# Patient Record
Sex: Female | Born: 1950 | Race: White | Hispanic: No | Marital: Married | Smoking: Never smoker
Health system: Western US, Academic
[De-identification: ages and names within clinical notes are randomized; demographics above are authoritative.]

## PROBLEM LIST (undated history)

## (undated) DIAGNOSIS — M65331 Trigger finger, right middle finger: Secondary | ICD-10-CM

## (undated) DIAGNOSIS — H6981 Other specified disorders of Eustachian tube, right ear: Secondary | ICD-10-CM

## (undated) DIAGNOSIS — K580 Irritable bowel syndrome with diarrhea: Secondary | ICD-10-CM

## (undated) DIAGNOSIS — E785 Hyperlipidemia, unspecified: Secondary | ICD-10-CM

## (undated) DIAGNOSIS — M159 Polyosteoarthritis, unspecified: Secondary | ICD-10-CM

## (undated) DIAGNOSIS — M170 Bilateral primary osteoarthritis of knee: Secondary | ICD-10-CM

## (undated) DIAGNOSIS — K5730 Diverticulosis of large intestine without perforation or abscess without bleeding: Secondary | ICD-10-CM

## (undated) DIAGNOSIS — J301 Allergic rhinitis due to pollen: Secondary | ICD-10-CM

## (undated) HISTORY — DX: Bilateral primary osteoarthritis of knee: M17.0

## (undated) HISTORY — DX: Irritable bowel syndrome with diarrhea: K58.0

## (undated) HISTORY — DX: Trigger finger, right middle finger: M65.331

## (undated) HISTORY — DX: Diverticulosis of large intestine without perforation or abscess without bleeding: K57.30

## (undated) HISTORY — DX: Hyperlipidemia, unspecified: E78.5

## (undated) HISTORY — DX: Polyosteoarthritis, unspecified: M15.9

## (undated) HISTORY — DX: Allergic rhinitis due to pollen: J30.1

## (undated) HISTORY — DX: Other specified disorders of eustachian tube, right ear: H69.81

---

## 2012-05-03 HISTORY — PX: COLONOSCOPY: GILAB00002

## 2013-06-27 ENCOUNTER — Other Ambulatory Visit
Admission: RE | Admit: 2013-06-27 | Discharge: 2013-06-27 | Disposition: A | Discharge status: Home / Self Care | Payer: BC Managed Care – PPO | Source: Ambulatory Visit | Attending: Gynecology | Admitting: Gynecology

## 2013-06-27 DIAGNOSIS — Z01419 Encounter for gynecological examination (general) (routine) without abnormal findings: Principal | ICD-10-CM | POA: Insufficient documentation

## 2013-07-03 LAB — CYTOLOGY GYN

## 2013-07-08 LAB — HPV DNA, HIGH RISK
HPV HIGH RISK GROUP: NEGATIVE
HPV TYPE 16: NEGATIVE
HPV TYPE 18: NEGATIVE

## 2016-03-29 ENCOUNTER — Encounter (HOSPITAL_BASED_OUTPATIENT_CLINIC_OR_DEPARTMENT_OTHER): Payer: Self-pay | Admitting: Family Medicine

## 2016-03-29 ENCOUNTER — Ambulatory Visit (HOSPITAL_BASED_OUTPATIENT_CLINIC_OR_DEPARTMENT_OTHER): Admitting: Family Medicine

## 2016-03-29 VITALS — BP 130/80 | HR 74 | Temp 97.7°F | Resp 12 | Ht 62.0 in | Wt 148.0 lb

## 2016-03-29 DIAGNOSIS — R0789 Other chest pain: Principal | ICD-10-CM

## 2016-03-29 DIAGNOSIS — J301 Allergic rhinitis due to pollen: Secondary | ICD-10-CM | POA: Insufficient documentation

## 2016-03-29 DIAGNOSIS — K2100 Gastro-esophageal reflux disease with esophagitis, without bleeding: Secondary | ICD-10-CM

## 2016-03-29 DIAGNOSIS — E785 Hyperlipidemia, unspecified: Secondary | ICD-10-CM | POA: Insufficient documentation

## 2016-03-29 DIAGNOSIS — H6981 Other specified disorders of Eustachian tube, right ear: Secondary | ICD-10-CM | POA: Insufficient documentation

## 2016-03-29 DIAGNOSIS — K580 Irritable bowel syndrome with diarrhea: Secondary | ICD-10-CM | POA: Insufficient documentation

## 2016-03-29 DIAGNOSIS — M65331 Trigger finger, right middle finger: Secondary | ICD-10-CM | POA: Insufficient documentation

## 2016-03-29 DIAGNOSIS — R0602 Shortness of breath: Secondary | ICD-10-CM

## 2016-03-29 DIAGNOSIS — K5730 Diverticulosis of large intestine without perforation or abscess without bleeding: Secondary | ICD-10-CM | POA: Insufficient documentation

## 2016-03-29 DIAGNOSIS — M159 Polyosteoarthritis, unspecified: Secondary | ICD-10-CM | POA: Insufficient documentation

## 2016-03-29 MED ORDER — OMEPRAZOLE 40 MG CAPSULE,DELAYED RELEASE
40.0000 mg | DELAYED_RELEASE_CAPSULE | Freq: Every day | ORAL | 0 refills | Status: AC
Start: 2016-03-29 — End: 2016-04-28

## 2016-03-29 NOTE — Progress Notes (Addendum)
HPI:  Rachel [name] is a [age]yr-old female who is here today for concerns of chest tightness, sore throat.    The sensation of soreness with a lump in her throat has been occurring mildly over the past month.  It has slowly increase over time.  She denies abdominal pain, N/V/D, constipation, BRBPR, hematochezia or melena.     Over this past week, her sensations have started to include chest pressure with shortness of breath.  However, she denies other cardiac-related symptoms - no headaches, lightheadedness, diaphoresis, peripheral edema or palpitations.   These episodes are not related to physical exertion; nausea/vomiting; sweating; pain radiation into the jaw or shoulder; extreme and sudden weakness or anxiety.    An ECG was ordered, which reveal NSR, normal wave morphology, intervals and axis.  There were no cardiac concerns identified.    We discussed possible etiologies, including cardiac concerns and possible GERD (esophageal spasm, esophageal stricture, hiatal hernia) as well as further treatment and workup.  She verbalized her understanding and willingness to move forward with the plan of care.    Patient is otherwise doing well.  Denies any concerns over other chronic health conditions.  Denies any recent acute illnesses.    30 minutes was spent with this patient. Over 20 minutes was spent in coordination and counseling.     ROS:  10-point ROS performed. Pertinent details discussed in HPI. Otherwise, denies any issues or other physical concerns    Physical Exam   Constitutional: She is oriented to person, place, and time. She appears well-developed.   HENT:   Head: Normocephalic and atraumatic.   Eyes: Conjunctivae and EOM are normal. Pupils are equal, round, and reactive to light.   Neck: Normal range of motion.   Cardiovascular: Normal rate, regular rhythm and normal heart sounds.  Exam reveals no gallop and no friction rub.    No murmur heard.  Abdominal: Soft. Bowel sounds are normal. She exhibits no  distension and no mass. There is no tenderness.   Musculoskeletal: Normal range of motion.   Neurological: She is alert and oriented to person, place, and time.   Skin: Skin is warm and dry.   Psychiatric: She has a normal mood and affect. Her behavior is normal. Judgment and thought content normal.   Vitals reviewed.

## 2016-04-11 LAB — POC ELECTROCARDIOGRAM WITH RHYTHM STRIP: QTC: 414 ms

## 2016-04-12 ENCOUNTER — Encounter (HOSPITAL_BASED_OUTPATIENT_CLINIC_OR_DEPARTMENT_OTHER): Payer: Self-pay | Admitting: Family Medicine

## 2016-04-12 ENCOUNTER — Ambulatory Visit (HOSPITAL_BASED_OUTPATIENT_CLINIC_OR_DEPARTMENT_OTHER): Admitting: Family Medicine

## 2016-04-12 VITALS — BP 142/78 | HR 66 | Temp 96.4°F | Resp 14 | Ht 64.0 in | Wt 146.8 lb

## 2016-04-12 DIAGNOSIS — R0989 Other specified symptoms and signs involving the circulatory and respiratory systems: Principal | ICD-10-CM

## 2016-04-12 DIAGNOSIS — R0602 Shortness of breath: Secondary | ICD-10-CM

## 2016-04-12 DIAGNOSIS — K2100 Gastro-esophageal reflux disease with esophagitis, without bleeding: Secondary | ICD-10-CM

## 2016-04-12 DIAGNOSIS — I361 Nonrheumatic tricuspid (valve) insufficiency: Secondary | ICD-10-CM

## 2016-04-12 DIAGNOSIS — R1310 Dysphagia, unspecified: Secondary | ICD-10-CM

## 2016-04-12 DIAGNOSIS — R0789 Other chest pain: Secondary | ICD-10-CM

## 2016-04-12 NOTE — Progress Notes (Signed)
HPI:  Rachel [name] is a [age]yr-old female who is here today for concerns of chest tightness, sore throat.    Today she is able to better clarify the sensation in her throat.  It feels like an air bubble or pocket is trapped below her throat [she indicates an area just above the sternal notch].  She feels as though she has to burp to release it.  Doing so can improve the sensation, but not always.  At times, it is better.  However, it seems to be there constantly.    She denies abdominal pain, N/V/D, constipation, BRBPR, hematochezia or melena.  She also denies true dysphagia.  Omeprazole did not really help with her symptoms.    In the interim time, chest pressure persists, but is better.  Her feelings of being short of breath has resolved.      She continues to deny other cardiac-related symptoms - no headaches, lightheadedness, diaphoresis, peripheral edema or palpitations.   As noted previously, these episodes are not related to physical exertion; nausea/vomiting; sweating; pain radiation into the jaw or shoulder; extreme and sudden weakness or anxiety.    We again discussed possible etiologies, including cardiac concerns, as well as further treatment and workup.  She verbalized her understanding and willingness to move forward with the plan of care.    Patient is otherwise doing well.  Denies any concerns over other chronic health conditions.  Denies any recent acute illnesses.    30 minutes was spent with this patient. Over 20 minutes was spent in coordination and counseling.     ROS:  10-point ROS performed. Pertinent details discussed in HPI. Otherwise, denies any issues or other physical concerns    Vital Signs:  BP 142/78  Pulse 66  Temp (!) 35.8 C (96.4 F) (Temporal)  Resp 14  Ht 1.626 m (5\' 4" )  Wt 66.6 kg (146 lb 12.8 oz)  SpO2 97%  BMI 25.2 kg/m2      Physical Exam   Constitutional: She is oriented to person, place, and time. She appears well-developed.   HENT:   Head: Normocephalic and  atraumatic.   Eyes: Conjunctivae and EOM are normal. Pupils are equal, round, and reactive to light.   Neck: Normal range of motion.   Cardiovascular: Normal rate, regular rhythm and normal heart sounds.  Exam reveals no gallop and no friction rub.    No murmur heard.  Abdominal: Soft. Bowel sounds are normal. She exhibits no distension and no mass. There is no tenderness.   Musculoskeletal: Normal range of motion.   Neurological: She is alert and oriented to person, place, and time.   Skin: Skin is warm and dry.   Psychiatric: She has a normal mood and affect. Her behavior is normal. Judgment and thought content normal.   Vitals reviewed.

## 2016-04-27 ENCOUNTER — Other Ambulatory Visit: Payer: Self-pay | Admitting: Gastroenterology

## 2016-04-27 DIAGNOSIS — R1314 Dysphagia, pharyngoesophageal phase: Secondary | ICD-10-CM

## 2016-05-01 ENCOUNTER — Ambulatory Visit
Admission: RE | Admit: 2016-05-01 | Discharge: 2016-05-01 | Disposition: A | Discharge status: Home / Self Care | Source: Ambulatory Visit | Attending: Gastroenterology | Admitting: Gastroenterology

## 2016-05-01 ENCOUNTER — Ambulatory Visit

## 2016-05-01 DIAGNOSIS — R072 Precordial pain: Principal | ICD-10-CM

## 2016-05-01 DIAGNOSIS — R1314 Dysphagia, pharyngoesophageal phase: Secondary | ICD-10-CM

## 2016-05-01 LAB — CBC WITH DIFFERENTIAL
BASOPHILS % AUTO: 0.5 % (ref 0.0–1.0)
BASOPHILS ABS AUTO: 0 10*3/uL (ref 0.0–0.1)
EOSINOPHIL % AUTO: 0.9 % (ref 0.0–4.0)
EOSINOPHIL ABS AUTO: 0.1 10*3/uL (ref 0.0–0.2)
HEMATOCRIT: 38.1 % (ref 36.0–48.0)
HEMOGLOBIN: 13.1 g/dL (ref 12.0–16.0)
IMMATURE GRANULOCYTES % AUTO: 0.2 % (ref 0.00–0.50)
IMMATURE GRANULOCYTES ABS AUTO: 0 10*3/uL (ref 0.0–0.0)
LYMPHOCYTE ABS AUTO: 2.1 10*3/uL (ref 1.3–2.9)
LYMPHOCYTES % AUTO: 32 % (ref 5.0–41.0)
MCH: 30.2 pg (ref 27.0–34.0)
MCHC g/dL: 34.4 g/dL (ref 33.0–37.0)
MCV: 87.8 fL (ref 82.0–97.0)
MONOCYTES % AUTO: 7.2 % (ref 0.0–10.0)
MONOCYTES ABS AUTO: 0.5 10*3/uL (ref 0.3–0.8)
MPV: 9.1 fL — AB (ref 9.4–12.4)
NEUTROPHIL ABS AUTO: 3.79 10*3/uL (ref 2.20–4.80)
NEUTROPHILS % AUTO: 59.2 % (ref 45.0–75.0)
NUCLEATED CELL COUNT: 0 10*3/uL (ref 0.0–0.1)
NUCLEATED RBC/100 WBC: 0 %{WBCs} (ref <=0.0)
Platelet Count: 227 10*3/uL (ref 151–365)
RDW: 11.9 % (ref 11.5–14.5)
RED CELL COUNT: 4.34 10*6/uL (ref 3.80–5.10)
WHITE BLOOD CELL COUNT: 6.4 10*3/uL (ref 4.2–10.8)

## 2016-05-01 LAB — COMPREHENSIVE METABOLIC PANEL
ALANINE TRANSFERASE (ALT): 26 U/L (ref 4–56)
ALB/GLOB RATIO_MMC: 1.6 (ref 1.0–1.6)
ALBUMIN: 4.2 g/dL (ref 3.2–4.7)
ALKALINE PHOSPHATASE (ALP): 57 U/L (ref 38–126)
ASPARTATE TRANSAMINASE (AST): 23 U/L (ref 9–44)
BILIRUBIN TOTAL: 0.6 mg/dL (ref 0.1–2.2)
BUN/CREATININE_MMC: 29.1 — AB (ref 7.3–21.7)
CARBON DIOXIDE TOTAL: 28 mmol/L (ref 22–32)
CHLORIDE: 105 mmol/L (ref 99–109)
CREATININE BLOOD: 0.55 mg/dL (ref 0.50–1.30)
Calcium: 9.7 mg/dL (ref 8.7–10.2)
E-GFR, NON-AFRICAN AMERICAN: 111 mL/min/{1.73_m2}
E-GFR_MMC: 111 mL/min/{1.73_m2}
GLOBULIN BLOOD_MMC: 2.7 g/dL (ref 2.2–4.2)
GLUCOSE: 97 mg/dL (ref 70–100)
POTASSIUM: 3.9 mmol/L (ref 3.5–5.2)
Protein: 6.9 g/dL (ref 5.9–8.2)
Sodium: 140 mmol/L (ref 134–143)
UREA NITROGEN, BLOOD (BUN): 16 mg/dL (ref 6–21)

## 2016-05-01 MED ORDER — BARIUM SULFATE 98 % ORAL POWDER FOR SUSPENSION
135.0000 mL | INHALATION_SUSPENSION | ORAL | Status: AC
Start: 2016-05-01 — End: 2016-05-01
  Administered 2016-05-01: 135 mL via ORAL

## 2016-05-06 ENCOUNTER — Ambulatory Visit
Admission: RE | Admit: 2016-05-06 | Discharge: 2016-05-06 | Disposition: A | Discharge status: Home / Self Care | Source: Ambulatory Visit | Attending: Family Medicine | Admitting: Family Medicine

## 2016-05-06 DIAGNOSIS — R0789 Other chest pain: Principal | ICD-10-CM

## 2016-05-06 DIAGNOSIS — R0602 Shortness of breath: Secondary | ICD-10-CM

## 2016-05-06 DIAGNOSIS — I361 Nonrheumatic tricuspid (valve) insufficiency: Secondary | ICD-10-CM

## 2016-05-06 DIAGNOSIS — I081 Rheumatic disorders of both mitral and tricuspid valves: Secondary | ICD-10-CM

## 2016-05-06 DIAGNOSIS — I5030 Unspecified diastolic (congestive) heart failure: Secondary | ICD-10-CM

## 2016-05-06 NOTE — Allied Health Procedure (Signed)
ECHOCARDIOGRAM PRELIMINARY REPORT      PATIENT: Rachel BlazerBarbara [name] Stock MRN: [id number]   DOB: [birth year] AGE: [age]yr         M-Mode        RVID (2.1-3.2):  2.9 ASC AO:  2.8 IVS (0.7-1.2): 1.0   LVIDd (4.0-5.6):  3.7   PW (0.7-12): 0.9   LVIDs (2.0-3.8):  2.4   %FS (25-50%): 35     LA:  4.8 major   4.1 minor LVOT Diameter: 2.0   ACS (1.6-2.6):  1.9 RA:  4.5 major   3.1 minor     AORoot (2.2-4.0):  2.9 L.A.V.I.: 29     LA (2.5-4.1):  3.3 IVC: 16   Collapse: yes PERICARDIAL  EFFUSION NO       MITRAL   E/A RATIO:   1.38     E/E:   8   GRADIENT MAX   NA Mean   NA mmHg   MV DECEL TIME   169   REGURG. TRACE    AORTIC   MAX   9     Mean   5 mmHg   PEAK VEL   1.54     V1 VTI   23     V2 VTI   33   V1/V2 Ratio   0.7   AVA   2.3 cm2 (Continuity Equation)   REGURG. NONE   IVRT   121          TRICUSPID   TR GRADIENT MAX   40 mmHg   REGURG. TRACE       PULMONIC   P.A.T.   138 msec.   REGURG. NONE           Tech Signature: Randa SpikeCecilia L Koda Defrank  Date: 05/06/2016  Time: 9:53am     Additional Details:    NL LV SIZE AND FUNCTION  LT ARM BP+ 134/73

## 2016-05-07 NOTE — Procedures (Signed)
ECHOCARDIOGRAM  PATIENT NAME:[name], Rachel ADMIT DATE: 05/06/2016   [name]    DOB:23-Feb-1951               DISCHARGE DATE:   AGE:66                       DATE OF VISIT:05/06/2016      INDICATION:    Followup valvular heart disease.     This is a complete transthoracic echocardiogram performed with 2D, M-mode, spectral Doppler, and color flow Doppler to evaluate ventricular and valvular structure and function.     The image quality of the study is good.    FINDINGS:    1. Left ventricle: Left ventricle has a small size with normal wall thickness. LVId 3.7 cm. Interventricular septum and posterior wall both 0.9 to 1.0 cm. Left ventricle has normal systolic function with ejection fraction of about 55% to 60%. Grade 2/4   diastolic dysfunction.   2. Aortic valve: A trileaflet structure. No aortic stenosis. No aortic regurgitation.   3. Mitral valve: Trace mitral regurgitation.   4. Left atrium: Mildly dilated.   5. Right ventricle: Normal size and function.   6. Pulmonic valve: No significant pulmonic regurgitation.   7. Tricuspid valve: Trace tricuspid regurgitation. No good TR jet to evaluate pulmonary artery systolic pressure.   8. Right atrium: Mildly dilated.   9. The interatrial septum is intact based on limited evaluation.   10. The inferior vena cava measures 1.6 cm and has normal respiratory variation.   11. No pericardial effusion.   12. No pleural effusion.   13. Aortic root is normal in size, 2.8 cm.    CONCLUSIONS:    1. Small left ventricle with normal wall thickness.   2. Normal left ventricular systolic function with ejection fraction of 60% to 65%.   3. Grade 2/4 diastolic dysfunction.   4. Trace mitral regurgitation/trace tricuspid regurgitation.   5. Mild biatrial enlargement.   6. Normal right ventricular size and function.   7. No pericardial effusion.        Electronically Signed by Alona Beneaming Kasy Iannacone, MD on 05/22/2016 13:07:46  Alona Beneaming Zein Helbing, MD    Carmon GinsbergSamuel J. Ceridon, MD  DI: DZ  DD: 05/06/2016 15:12:44            JOB: [id number]  DT: 05/06/2016 20:14:38  DICT ID [id number]   MT: TM      Alona Beneaming Vaidehi Braddy, MD             PATIENT NAMMarland Kitchen: [name], Rachel                              [name]                               [id number]                             CSN-VISIT ID: [id number]                             DATE: 05/06/2016                             PATIENT ROOM #:  ECHOCARDIOGRAM   Page  2 of 2

## 2016-05-12 ENCOUNTER — Ambulatory Visit (HOSPITAL_BASED_OUTPATIENT_CLINIC_OR_DEPARTMENT_OTHER): Payer: Self-pay | Admitting: Family Medicine

## 2016-05-24 ENCOUNTER — Telehealth (HOSPITAL_BASED_OUTPATIENT_CLINIC_OR_DEPARTMENT_OTHER): Payer: Self-pay | Admitting: Family Medicine

## 2016-05-24 NOTE — Telephone Encounter (Signed)
Stated that she is in [place] visiting her sister and her dog bit her lip, broke skin; she's ok, but wants to know if she's up to date on her tetanus vaccine.  I checked her chart in [facility]; she had a tdap 08/11/15

## 2016-05-24 NOTE — Telephone Encounter (Signed)
There was an order for her to get Tdap, but didn't get it. No record of last tetanus. We were out of the TD for quite sometime.

## 2016-05-24 NOTE — Telephone Encounter (Signed)
Without actual verification of Td or TdaP, she should be considered not up-to-date.  I recommended that she present to an urgent care clinic to obtain a Td if not the TdaP (recommend getting the TdaP).

## 2016-05-25 NOTE — Telephone Encounter (Signed)
Spoke with patient, she will get a td at a urgent care facility in [place]

## 2016-05-30 ENCOUNTER — Encounter (HOSPITAL_BASED_OUTPATIENT_CLINIC_OR_DEPARTMENT_OTHER): Payer: Self-pay | Admitting: Family Medicine

## 2016-06-05 ENCOUNTER — Encounter (HOSPITAL_BASED_OUTPATIENT_CLINIC_OR_DEPARTMENT_OTHER): Payer: Self-pay | Admitting: Family Medicine

## 2016-06-05 DIAGNOSIS — M170 Bilateral primary osteoarthritis of knee: Secondary | ICD-10-CM | POA: Insufficient documentation

## 2016-06-06 ENCOUNTER — Encounter (HOSPITAL_BASED_OUTPATIENT_CLINIC_OR_DEPARTMENT_OTHER): Payer: Self-pay | Admitting: Family Medicine

## 2016-06-06 ENCOUNTER — Ambulatory Visit (HOSPITAL_BASED_OUTPATIENT_CLINIC_OR_DEPARTMENT_OTHER): Admitting: Family Medicine

## 2016-06-06 VITALS — BP 142/76 | HR 74 | Temp 97.7°F | Resp 12 | Wt 148.0 lb

## 2016-06-06 DIAGNOSIS — K5730 Diverticulosis of large intestine without perforation or abscess without bleeding: Secondary | ICD-10-CM

## 2016-06-06 DIAGNOSIS — M170 Bilateral primary osteoarthritis of knee: Secondary | ICD-10-CM

## 2016-06-06 DIAGNOSIS — K2100 Gastro-esophageal reflux disease with esophagitis, without bleeding: Secondary | ICD-10-CM

## 2016-06-06 DIAGNOSIS — R0989 Other specified symptoms and signs involving the circulatory and respiratory systems: Secondary | ICD-10-CM

## 2016-06-06 DIAGNOSIS — Z1231 Encounter for screening mammogram for malignant neoplasm of breast: Secondary | ICD-10-CM

## 2016-06-06 DIAGNOSIS — J301 Allergic rhinitis due to pollen: Secondary | ICD-10-CM

## 2016-06-06 DIAGNOSIS — K580 Irritable bowel syndrome with diarrhea: Secondary | ICD-10-CM

## 2016-06-06 DIAGNOSIS — M159 Polyosteoarthritis, unspecified: Secondary | ICD-10-CM

## 2016-06-06 DIAGNOSIS — Z1239 Encounter for other screening for malignant neoplasm of breast: Secondary | ICD-10-CM

## 2016-06-06 DIAGNOSIS — R1314 Dysphagia, pharyngoesophageal phase: Secondary | ICD-10-CM

## 2016-06-06 DIAGNOSIS — E785 Hyperlipidemia, unspecified: Secondary | ICD-10-CM

## 2016-06-06 DIAGNOSIS — R0789 Other chest pain: Principal | ICD-10-CM

## 2016-06-06 DIAGNOSIS — W540XXS Bitten by dog, sequela: Secondary | ICD-10-CM

## 2016-06-06 DIAGNOSIS — H6981 Other specified disorders of Eustachian tube, right ear: Secondary | ICD-10-CM

## 2016-06-06 NOTE — Patient Instructions (Signed)
DASH Diet: Care Instructions  Your Care Instructions  The DASH diet is an eating plan that can help lower your blood pressure. DASH stands for Dietary Approaches to Stop Hypertension. Hypertension is high blood pressure.  The DASH diet focuses on eating foods that are high in calcium, potassium, and magnesium. These nutrients can lower blood pressure. The foods that are highest in these nutrients are fruits, vegetables, low-fat dairy products, nuts, seeds, and legumes. But taking calcium, potassium, and magnesium supplements instead of eating foods that are high in those nutrients does not have the same effect. The DASH diet also includes whole grains, fish, and poultry.  The DASH diet is one of several lifestyle changes your doctor may recommend to lower your high blood pressure. Your doctor may also want you to decrease the amount of sodium in your diet. Lowering sodium while following the DASH diet can lower blood pressure even further than just the DASH diet alone.  Follow-up care is a key part of your treatment and safety. Be sure to make and go to all appointments, and call your doctor if you are having problems. It's also a good idea to know your test results and keep a list of the medicines you take.  How can you care for yourself at home?  Following the DASH diet   Eat 4 to 5 servings of fruit each day. A serving is 1 medium-sized piece of fruit,  cup chopped or canned fruit, 1/4 cup dried fruit, or 4 ounces ( cup) of fruit juice. Choose fruit more often than fruit juice.   Eat 4 to 5 servings of vegetables each day. A serving is 1 cup of lettuce or raw leafy vegetables,  cup of chopped or cooked vegetables, or 4 ounces ( cup) of vegetable juice. Choose vegetables more often than vegetable juice.   Get 2 to 3 servings of low-fat and fat-free dairy each day. A serving is 8 ounces of milk, 1 cup of yogurt, or 1  ounces of cheese.   Eat 6 to 8 servings of grains each day. A serving is 1 slice of  bread, 1 ounce of dry cereal, or  cup of cooked rice, pasta, or cooked cereal. Try to choose whole-grain products as much as possible.   Limit lean meat, poultry, and fish to 2 servings each day. A serving is 3 ounces, about the size of a deck of cards.   Eat 4 to 5 servings of nuts, seeds, and legumes (cooked dried beans, lentils, and split peas) each week. A serving is 1/3 cup of nuts, 2 tablespoons of seeds, or  cup of cooked beans or peas.   Limit fats and oils to 2 to 3 servings each day. A serving is 1 teaspoon of vegetable oil or 2 tablespoons of salad dressing.   Limit sweets and added sugars to 5 servings or less a week. A serving is 1 tablespoon jelly or jam,  cup sorbet, or 1 cup of lemonade.   Eat less than 2,300 milligrams (mg) of sodium a day. If you have high blood pressure, diabetes, or chronic kidney disease, if you are African-American, or if you are older than age 50, try to limit the amount of sodium you eat to less than 1,500 mg a day.  Tips for success   Start small. Do not try to make dramatic changes to your diet all at once. You might feel that you are missing out on your favorite foods and then be more likely   to not follow the plan. Make small changes, and stick with them. Once those changes become habit, add a few more changes.   Try some of the following:   Make it a goal to eat a fruit or vegetable at every meal and at snacks. This will make it easy to get the recommended amount of fruits and vegetables each day.   Try yogurt topped with fruit and nuts for a snack or healthy dessert.   Add lettuce, tomato, cucumber, and onion to sandwiches.   Combine a ready-made pizza crust with low-fat mozzarella cheese and lots of vegetable toppings. Try using tomatoes, squash, spinach, broccoli, carrots, cauliflower, and onions.   Have a variety of cut-up vegetables with a low-fat dip as an appetizer instead of chips and dip.   Sprinkle sunflower seeds or chopped almonds over salads.  Or try adding chopped walnuts or almonds to cooked vegetables.   Try some vegetarian meals using beans and peas. Add garbanzo or kidney beans to salads. Make burritos and tacos with mashed pinto beans or black beans.   Where can you learn more?   Go to https://www.healthwise.net/patiented  Enter H967 in the search box to learn more about "DASH Diet: Care Instructions."    2006-2015 [employer]. Care instructions adapted under license by [facility]. This care instruction is for use with your licensed healthcare professional. If you have questions about a medical condition or this instruction, always ask your healthcare professional. Healthwise, Incorporated disclaims any warranty or liability for your use of this information.  Content Version: 10.6.465758; Current as of: July 11, 2013

## 2016-06-06 NOTE — Progress Notes (Signed)
HPI:  Rachel BlazerBarbara [name] [name] is a [age]yr-old female who is here today for Routine HCM appointment with continued care and management of uncomfortable feeling/pressure in chest .    Patient underwent Echo prior to this appointment. We reviewed and discussed the results in detail.     The study revealed 2/4 Diastolic Heart Failure and trace valve abnormalities.  LVEF was 60-65%.    The results otherwise revealed no concerns or signficant abnormalities.  I recommmended follow Echo in approximately 2 years.    She denies cardiac-related symptoms - no headaches, lightheadedness, chest pain, diaphoresis, SOB/DOE, peripheral edema or palpitations.  Her blood pressure is mildly elevated today.  We discussed lifestyle changes that can improve this as well as maintain a healthy heart overall.    She does not have other immediate health concerns today.  We discussed recent acute and other chronic health issues. Pt was recently bitten by a dog and is taking a course of antibiotics for that. Pt received a tentanus shot for the bite.  She denies any further problems with dysphagia or sensation that something is in her throat.    Patient is otherwise doing well.  Denies any concerns over other chronic health conditions.  Denies any recent acute illnesses.  We made preparations for another Annual Physical in 3 months.    30 minutes was spent with this patient. Over 20 minutes was spent in coordination and counseling.     ROS:  10-point ROS performed. Pertinent details discussed in HPI. Otherwise, denies any issues or other physical concerns    Vitals:  BP 142/76  Pulse 74  Temp 36.5 C (97.7 F) (Temporal)  Resp 12  Wt 67.1 kg (148 lb)  SpO2 98%  BMI 25.4 kg/m2    Physical Exam   Constitutional: She is oriented to person, place, and time. She appears well-developed and well-nourished.   HENT:   Head: Normocephalic and atraumatic.   Cardiovascular: Normal rate, regular rhythm and normal heart sounds.    Pulmonary/Chest: Effort  normal and breath sounds normal.   Neurological: She is alert and oriented to person, place, and time.   Skin: Skin is warm and dry.   Psychiatric: She has a normal mood and affect. Her behavior is normal.     Assessment:  1. Chest pressure    2. Dog bite, sequela    3. Dysphagia, pharyngoesophageal    4. Foreign body sensation in throat    5. Dyslipidemia    6. Primary osteoarthritis of knees, bilateral    7. Chronic seasonal allergic rhinitis due to pollen    8. Chronic dysfunction of right eustachian tube    9. 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 Irritable bowel syndrome with diarrhea    11. Diverticulosis of large intestine without hemorrhage    12. Gastroesophageal reflux disease with esophagitis    13. Breast cancer screening      Note:  I saw this patient with FNP Student, Greg CutterKatie Bowers.  I agree with her assessment and plan.

## 2016-06-07 ENCOUNTER — Encounter (HOSPITAL_BASED_OUTPATIENT_CLINIC_OR_DEPARTMENT_OTHER): Payer: Self-pay | Admitting: Family Medicine

## 2016-06-28 ENCOUNTER — Ambulatory Visit
Admission: RE | Admit: 2016-06-28 | Discharge: 2016-07-03 | Disposition: A | Discharge status: Home / Self Care | Source: Ambulatory Visit | Attending: Family Medicine | Admitting: Family Medicine

## 2016-06-28 DIAGNOSIS — Z1239 Encounter for other screening for malignant neoplasm of breast: Secondary | ICD-10-CM

## 2016-06-28 DIAGNOSIS — Z1231 Encounter for screening mammogram for malignant neoplasm of breast: Principal | ICD-10-CM

## 2016-09-14 ENCOUNTER — Ambulatory Visit (HOSPITAL_BASED_OUTPATIENT_CLINIC_OR_DEPARTMENT_OTHER): Admitting: Family Medicine

## 2016-09-14 ENCOUNTER — Encounter (HOSPITAL_BASED_OUTPATIENT_CLINIC_OR_DEPARTMENT_OTHER): Payer: Self-pay | Admitting: Family Medicine

## 2016-09-14 VITALS — BP 130/80 | HR 72 | Temp 98.0°F | Resp 16 | Wt 147.0 lb

## 2016-09-14 DIAGNOSIS — M65331 Trigger finger, right middle finger: Secondary | ICD-10-CM

## 2016-09-14 DIAGNOSIS — Z7185 Encounter for immunization safety counseling: Secondary | ICD-10-CM

## 2016-09-14 DIAGNOSIS — E785 Hyperlipidemia, unspecified: Secondary | ICD-10-CM

## 2016-09-14 DIAGNOSIS — M810 Age-related osteoporosis without current pathological fracture: Secondary | ICD-10-CM | POA: Insufficient documentation

## 2016-09-14 DIAGNOSIS — M25561 Pain in right knee: Secondary | ICD-10-CM

## 2016-09-14 DIAGNOSIS — Z7189 Other specified counseling: Secondary | ICD-10-CM

## 2016-09-14 DIAGNOSIS — H8110 Benign paroxysmal vertigo, unspecified ear: Secondary | ICD-10-CM

## 2016-09-14 DIAGNOSIS — M159 Polyosteoarthritis, unspecified: Secondary | ICD-10-CM

## 2016-09-14 DIAGNOSIS — H6981 Other specified disorders of Eustachian tube, right ear: Secondary | ICD-10-CM

## 2016-09-14 DIAGNOSIS — M25562 Pain in left knee: Secondary | ICD-10-CM

## 2016-09-14 DIAGNOSIS — J301 Allergic rhinitis due to pollen: Secondary | ICD-10-CM

## 2016-09-14 DIAGNOSIS — G8929 Other chronic pain: Secondary | ICD-10-CM

## 2016-09-14 DIAGNOSIS — K5730 Diverticulosis of large intestine without perforation or abscess without bleeding: Secondary | ICD-10-CM

## 2016-09-14 DIAGNOSIS — Z0000 Encounter for general adult medical examination without abnormal findings: Principal | ICD-10-CM

## 2016-09-14 DIAGNOSIS — K580 Irritable bowel syndrome with diarrhea: Secondary | ICD-10-CM

## 2016-09-14 DIAGNOSIS — E559 Vitamin D deficiency, unspecified: Secondary | ICD-10-CM

## 2016-09-14 NOTE — Progress Notes (Signed)
HPI:  Rachel [name] is a 80yr yo female who is here today for an Annual Wellness Exam.    We reviewed PMH/PSH, family and social histories.  I obtained further information when needed and entered it into the EMR chart.    She reports dizziness over the past 2-3 days.  She notes episodes primarily at night when she rolls over in bed.  However, she has noted episodes when turning her head while standing up as well.  She has experienced tinnitus in the past, but denies any such complaint with these current episodes.  She is experiencing nausea with the more severe episodes, but denies other GI problems - no abdominal pain, vomiting, diarrhea, constipation, BRBPR, hematochezia or melena.  She denies any other URI symptoms as well.    We discussed probable etiology - BPPV and treatment options as well as the risks and benefits of each option.  Because of the history of tinnitus, we also discussed Meniere's in brief.  She will continue to monitor this over time.    She also reports increased pain and stiffness related to Osteoarthritis of the knees and hips.  We discussed steroid injections of the knees.  She is not interested now, but may be in the future.  She will schedule an appointment if and when she is interested in getting an injection.    She has no other immediate health concerns.  Patient obtained labs prior to this appointment. We reviewed and discussed the results in detail.  These were obtained ~4 months ago.  The results revealed no concerns or signficant abnormalities.  They indicate that various health conditions are stable.     Follow up labs were ordered for today.  Patient forgot to obtain them.  A new set was reordered and included other labs for continued monitoring of Osteoporosis/Vitamin D Deficiency.  These can be obtained when possible.  The results will be reviewed and a letter will be sent discussing those.  If she has any questions, she will contact the clinic.    Patient is otherwise  doing well.  Denies any concerns over other chronic health conditions.  Denies any recent acute illnesses.    ROS:  Reviewed of Systems was performed. Pertinent details discussed in HPI. Otherwise, denies any issues or other physical concerns.    Advanced Care Planning  We spent approximately 20 minutes and discussed End of Life Care as well as the decisions and planning associated with such care.  I assured the patient that such planning is voluntary and not a requirement to receive medical care.    We also reviewed the sections of the documentation associated with such decisions.  Copies of these documents were provided.    Routine [employer] provided by Robie [facility].  - LMP:  ~2011  - Last Pap/pelvic:  04/08/2015       - h/o abnormal exam:  No  - Last Mammogram:  06/23/2015       - h/o abnormal mammograms:  No    Preventative Healthcare and Screens  Bone Health  - Last DEXA Scan:  08/02/2015  - LaResults:  Osteoporosis with a T-Score to -2.5    GI Health  - Last Colon Cancer Screening:  05/03/2012  - Last colonoscopy:  05/03/2012       - Monitoring schedule:  04/2022   - Hepatitis C Screen:  Refused    Ocular Health  - Routine visits with eye specialist:  Annual exams with  glaucoma screens  We discussed the importance of routine eye care to monitor for conditions such as glaucoma and cataracts.    Dental Health   - Routine dental visits:  Semiannual cleanings.  Annual check ups with X-Rays   We discussed the importance for continued dental care.    Dermatologic Health  - Routine visits to a Dermatologist:  Established with Dr. No.  Routine skin checks performed.  We discussed the importance of regular skin checks - both personal (with aid of family members) and by a medical professional.  We also discussed the need for skin protection from the sun.    Vaccinations:  - Flu vaccination:  Refused  - Pneumococcal vaccination:     - PPSV23:  Appropriate for next year   - PCV13:  Discussed  -  Td/TdaP vaccination:  TdaP - 08/11/2015  - History of chicken pox:  Unsure  - History of Shingles Outbreak:  No  - Shingles Vaccination:  Zostavax 08/11/2015    ROS:  10-point ROS performed. Pertinent details discussed in HPI. Otherwise, denies any issues or other physical concerns    Vitals:  BP 130/80  Pulse 72  Temp 36.7 C (98 F) (Temporal)  Resp 16  Wt 66.7 kg (147 lb)  SpO2 98%  BMI 25.23 kg/m2    Physical Exam   Constitutional: She is oriented to person, place, and time. She appears well-developed and well-nourished. No distress.   HENT:   Head: Normocephalic and atraumatic.   Right Ear: External ear normal.   Left Ear: External ear normal.   Nose: Nose normal.   Mouth/Throat: Oropharynx is clear and moist. No oropharyngeal exudate.   Eyes: Conjunctivae and EOM are normal. Pupils are equal, round, and reactive to light. No scleral icterus.   Neck: Normal range of motion. Neck supple. No JVD present. No thyromegaly present.   Cardiovascular: Normal rate, regular rhythm, normal heart sounds and intact distal pulses.  Exam reveals no gallop and no friction rub.    No murmur heard.  Pulmonary/Chest: Effort normal and breath sounds normal. No respiratory distress. She has no wheezes. She has no rales.   Abdominal: Soft. Bowel sounds are normal. She exhibits no distension and no mass. There is no tenderness. No hernia.   Musculoskeletal: Normal range of motion. She exhibits no tenderness or deformity.   Lymphadenopathy:     She has no cervical adenopathy.   Neurological: She is alert and oriented to person, place, and time. She has normal reflexes. She displays normal reflexes. No cranial nerve deficit. She exhibits normal muscle tone. Coordination normal.   Skin: Skin is warm and dry.     Assessment:  1. Annual physical exam    2. Immunization counseling    3. Counseling regarding end of life decision making    4. Benign paroxysmal positional vertigo, unspecified laterality    5. Chronic pain of both knees     6. 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 Dyslipidemia    8. Senile osteoporosis    9. Vitamin D deficiency    10. Irritable bowel syndrome with diarrhea    11. Chronic seasonal allergic rhinitis due to pollen    12. Chronic dysfunction of right eustachian tube    13. Trigger middle finger of right hand    14. Diverticulosis of large intestine without hemorrhage      Plan:  1. CMP, Vitamin D, Phosphorus, Lipid Panel ordered now.  Repeats will  be ordered for next appointment as warranted.  2. Referral to PT for treatment of BPPV  3. RTC 4 Months for a Routine HCM visit.

## 2016-09-25 ENCOUNTER — Telehealth: Payer: Self-pay | Admitting: Rehabilitative and Restorative Service Providers"

## 2016-09-25 ENCOUNTER — Ambulatory Visit: Admitting: Rehabilitative and Restorative Service Providers"

## 2016-09-25 DIAGNOSIS — H938X1 Other specified disorders of right ear: Secondary | ICD-10-CM

## 2016-09-25 DIAGNOSIS — R42 Dizziness and giddiness: Principal | ICD-10-CM

## 2016-09-25 NOTE — Progress Notes (Signed)
PHYSICAL THERAPY EVALUATION 09/25/2016     Diagnosis:  Therapy diagnosis: R42 Dizziness    Authorizing MD: Winfred Leedseridon, Samuel  Onset Date: 09/20/16    Start of Care Date: 09/25/2016   Prior Level of function: I  Prior treatment for this diagnosis: no    Precautions: none    PLAN OF CARE:  Established 09/25/2016   1 time a week x 6 weeks for a total of 6 treatments.   To include gaze stabilization exercises, habituation exercises, balance training, and instruction in a home exercise program.    GOALS:   1. Improved DHI score to <or=10/100.  2. Pt with improved gaze stabilization and able to perform X1 and X2 without difficulty.  3. Pt with improved balance and able to stand on foam with eyes closed with minimal sway.  4. Pt independent with a home exercise program.      Time in: 1015   Total Evaluation time: 45   Treatment time in addition to evaluation: 10         Clinical Evaluation     SUBJECTIVE  History of current problem:                                   Rachel [name] is a [age]yr old female referred to physical therapy due to an episode of severe vertigo and nausea that occurred while rolling over in bed. Symptoms lasted for 2 days and then resolved. She still suffers from occasional light-headedness with changes in position (looking up, rolling over in bed, bending over). She also reports disequilibrium when closing her eyes and tipping her head back in the shower to rinse her hair. She states that she also suffers from tinnitus and a sense of fullness in the R ear.     Subjective tests   Dizziness Handicap inventory: 18/100. (0-30 = mild, 31-60 = moderate, > 60 = severe).     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 Eddie Candleummings.  Next one due in 04/2022           Social History/Personal and/or environmental factors effecting the plan of care:    Social History     Social History Narrative       OBJECTIVE EXAM:  Appearance:   healthy, alert and cooperative    Cervical ROM:  WNL    OCULOMOTOR ASSESSMENT:  All normal, except where indicated, including:       Without Goggles:         Smooth Pursuit:       Vergance:       Saccadic:       VOR cancellation:         Head Thrust: Positive to the R  Head Shake Nystagmus:        Visual Acuity, static vs. dynamic: WNL (within 3 lines).    BALANCE TESTS:    Modified Clinical Test of Sensory Interaction and Balance  Condition Vision/Surface Strategy Sway Score   1 Eyes Open    Firm Surface     0 = ankle      1 = hip      2 = other        1 = Minimal       2 = Mild       3 = Moderate       4 = Fall 1     2 Eyes Closed    Firm Surface     0 = ankle      1 = hip      2 = other      1 = Minimal       2 = Mild       3 = Moderate       4 = Fall 1     4 Eyes Open    Compliant Surface       0 = ankle      1 = hip      2 = other      1 = Minimal       2 = Mild       3 = Moderate       4 = Fall 1     5 Eyes Closed    Compliant Surface       0 = ankle      1 = hip      2 = other      1 = Minimal       2 = Mild       3 = Moderate       4 = Fall 3     This test assesses how well someone uses vision, somatosensation and vestibular input for balance.  The ablility to pass the 4th condition (standing on compliant surface with eyes open), but  difficulty passing the last condition indicates patient has difficulty  using the vestibular system for balance.      TESTS FOR BENIGN, PAROXYSMAI VERTIGO  DIX HALLPIKE:  Head right: Negative, Head left: Negative  Roll Test (horizontal canals):  Head right: Neg  Head left: Neg  Nystagmus present: no nystasgmus.     Treatment today in addition to evaluation:    Neuro re-ed x 10 min: X1 Gaze stabilization exercises standing with target 10 ft away performing horizontal and vertical head movements   Patient struggled to keep eyes on the target when she moved her head to the right.   Patient was educated about vestibular problems and how vestibular problems can explain patient's symptoms. Instructed in a home exercise program. Handout issued.      ASSESSMENT: The patient presents with decreased function due to impaired gaze stabilization as a result of an impaired vestibular ocular reflex. She tested negative for BPPV today and has not experienced any symptoms of vertigo or nausea since the day of the date of onset. She still has light-headedness and dysequilibrium when standing in the shower tilting her head back with eyes closed. She demonstrated moderate sway during the Modified Clinical Test of Sensory Interaction and Balance test demonstrating that her ability to use her vestibular system for balance is impaired. The patient would benefit from vestibular rehab in order to  improve her gaze stabilization and balance. Her additional auditory symptoms (fullness in the R ear and tinnitus) suggest that she could have Meniere's or a vestibular hypofunction. I recommend a referral to be evaluated by an ENT and audiologist.    Rehab Potential: Good    Clinical Presentation: evolving/changing characteristics    The components of this evaluation necessitated a Low complexity level of clinical decision making.      Plan for next visit: DGI, Gaze stabilization ex, vestibular integration balance exercises     In the event of unattended discharge, this note will serve as discharge  document      Patient Education:      Method of Teaching: demonstration, verbal  and written hand-out     Learner: patient     Response: verbalizes understanding and able to give return demonstration       Has the plan of care been explained to the patient/caregiver? yes       Is the patient able to understand the plan of care: yes       Does the patient/caregiver(s) agree with the plan of care: yes       Are there barriers to learning? no.         Motivated to learn? yes       Best learning method: verbal,or demo      Patient/Family participation and agreement with recommendations: verbalized agreement    Tish Men, PT, DPT

## 2016-09-25 NOTE — Telephone Encounter (Signed)
Referral made 

## 2016-09-25 NOTE — Telephone Encounter (Signed)
Dr. Dulcy Fannyeridon,  Please create a referral for Rachel [name] to be evaluated by an ENT and an audiologist due to her symptoms of tinnitus and sense of fullness in the R ear. I recommend Dr. Almeta Monashase (ENT) and Babs BertinMichelle Throp (audiologist). Thank you.  Joanne Charseresa Elimelech Houseman, DPT

## 2016-09-25 NOTE — Addendum Note (Signed)
Addended by: Winfred LeedsERIDON, Nica Friske on: 09/25/2016 02:34 PM     Modules accepted: Orders

## 2016-10-02 ENCOUNTER — Ambulatory Visit: Admitting: Rehabilitative and Restorative Service Providers"

## 2016-10-02 DIAGNOSIS — R42 Dizziness and giddiness: Principal | ICD-10-CM

## 2016-10-02 NOTE — Progress Notes (Signed)
Physical Therapy Treatment Note: 10/02/2016   Time in 1130    Total # of visits: 2/6    Total Treatment Time: 25 min    Subjective: I was doing okay until this weekend. Woke up on Sat feeling nauseous and off balance. Spinning when rolling over in bed. Also get dizzy when attempting to bend over. I see the ENT and audiologist [phone].     Objective:   Patient seen for the following treatment:  Dix Hallpike R and L  CRT (Epley's) for R ear  Pt ed for instruction on how to perform Epley's at home    Assessment: No nystagmus present with Gilberto Betterix Hallpike. Reported some vertigo when initially coming back up to sit after performing Gilberto Betterix Hallpike on R ear. Pt was given a handout of how to perform the Epley's at home. If no success with Epley's will rule out BPPV. Pt also with feeling of fullness in R ear and has been referred to an ENT to determine if pt has Meniere's or vestibular hypofunction.    Plan: Continue physical therapy - as per plan of care. Continue with gaze stabilization, habituation, and balance ex next tx if no success after trial of doing CRT.    In the event of unattended discharge, this note will serve as discharge document.    Joanne Charseresa Aja Whitehair, PT, DPT  Physical Therapist

## 2016-10-09 ENCOUNTER — Ambulatory Visit: Attending: Family Medicine | Admitting: Rehabilitative and Restorative Service Providers"

## 2016-10-09 DIAGNOSIS — R42 Dizziness and giddiness: Secondary | ICD-10-CM

## 2016-10-09 NOTE — Progress Notes (Signed)
Physical Therapy Treatment Note: 10/09/2016   Time in 1015    Total # of visits: 3/6    Total Treatment Time: 30 min    Subjective: My appt with ENT was moved to May 30th.    Objective:   Patient seen for the following treatment:  Neuro re-ed x 30 min including   Gaze stabilization ex: X1 viewing in standing position with target 10 ft away. Pt standing on compliant surface. Busy background. Speed increasing from slow to mod to fast. Horizontal and vertical head movement. X2 viewing seated with target moving in same direction and in opp direction.    Balance training: Stand on foam EC feet apart > ft together, Stand on air disc EO > EC    Assessment: Pt did better with X1 gaze stabilization ex today. Difficulty with X2. Tended to lose the target when rotating head left and tilting head up. Reported feeling a little "woozy" after balance ex.    Plan: Continue physical therapy - as per plan of care.     In the event of unattended discharge, this note will serve as discharge document.    Joanne Charseresa Hurschel Paynter, PT, DPT  Physical Therapist

## 2016-10-18 ENCOUNTER — Ambulatory Visit: Attending: Family Medicine

## 2016-10-18 ENCOUNTER — Ambulatory Visit (HOSPITAL_BASED_OUTPATIENT_CLINIC_OR_DEPARTMENT_OTHER): Admitting: Allergy & Immunology

## 2016-10-18 ENCOUNTER — Encounter (HOSPITAL_BASED_OUTPATIENT_CLINIC_OR_DEPARTMENT_OTHER): Payer: Self-pay | Admitting: Allergy & Immunology

## 2016-10-18 VITALS — BP 100/64 | HR 86 | Temp 97.8°F | Ht 63.0 in | Wt 147.0 lb

## 2016-10-18 DIAGNOSIS — Z0000 Encounter for general adult medical examination without abnormal findings: Secondary | ICD-10-CM

## 2016-10-18 DIAGNOSIS — M810 Age-related osteoporosis without current pathological fracture: Secondary | ICD-10-CM

## 2016-10-18 DIAGNOSIS — H6121 Impacted cerumen, right ear: Secondary | ICD-10-CM

## 2016-10-18 DIAGNOSIS — M159 Polyosteoarthritis, unspecified: Secondary | ICD-10-CM

## 2016-10-18 DIAGNOSIS — E559 Vitamin D deficiency, unspecified: Secondary | ICD-10-CM

## 2016-10-18 DIAGNOSIS — E785 Hyperlipidemia, unspecified: Principal | ICD-10-CM

## 2016-10-18 DIAGNOSIS — H8112 Benign paroxysmal vertigo, left ear: Principal | ICD-10-CM

## 2016-10-18 LAB — COMPREHENSIVE METABOLIC PANEL
ALANINE TRANSFERASE (ALT): 21 U/L (ref 4–56)
ALBUMIN: 4.1 g/dL (ref 3.2–4.7)
ALKALINE PHOSPHATASE (ALP): 49 U/L (ref 38–126)
ASPARTATE TRANSAMINASE (AST): 19 U/L (ref 9–44)
Alb/Glob Ratio: 1.5 (ref 1.0–1.6)
BILIRUBIN TOTAL: 0.6 mg/dL (ref 0.1–2.2)
BUN/CREATININE_MMC: 27.5 — AB (ref 7.3–21.7)
CALCIUM: 9.2 mg/dL (ref 8.7–10.2)
CARBON DIOXIDE TOTAL: 27 mmol/L (ref 22–32)
Chloride: 106 mmol/L (ref 99–109)
Creatinine Serum: 0.51 mg/dL (ref 0.50–1.30)
E-GFR, NON-AFRICAN AMERICAN: 121 mL/min/{1.73_m2} (ref 60–?)
E-GFR_MMC: 121 mL/min/{1.73_m2} (ref 60–?)
GLOBULIN BLOOD_MMC: 2.7 g/dL (ref 2.2–4.2)
GLUCOSE: 99 mg/dL (ref 70–100)
POTASSIUM: 3.5 mmol/L (ref 3.5–5.2)
PROTEIN: 6.8 g/dL (ref 5.9–8.2)
SODIUM: 140 mmol/L (ref 134–143)
Urea Nitrogen, Blood (BUN): 14 mg/dL (ref 6–21)

## 2016-10-18 LAB — LIPID PANEL
CHOL HDL RATIO MMC: 2.9 mg/dL (ref 2.0–5.0)
CHOLESTEROL: 242 mg/dL — AB (ref 112–200)
HDL CHOLESTEROL: 84 mg/dL (ref 40–?)
LDL CHOLESTEROL CALCULATION: 137 mg/dL — AB (ref <100)
NON-HDL CHOLESTEROL: 158 mg/dL — AB (ref <=150.0)
TRIGLYCERIDE: 103 mg/dL (ref 30–150)

## 2016-10-18 LAB — VITAMIN D, 25 HYDROXY: VITAMIN D, 25 HYDROXY: 29.7 ng/mL — AB (ref 30–80)

## 2016-10-18 LAB — PHOSPHORUS (PO4): PHOSPHORUS (PO4): 3.1 mg/dL (ref 2.4–4.7)

## 2016-10-18 NOTE — Progress Notes (Signed)
HPI: I reviewed the medical, surgical, family and social histories. The patient is referred by Ceridon, Clelia SchaumannSamuel J W, MD.Approximately 3 weeks ago Benna DunksBarber developed vertigo and nausea when rolling over in bed from her right side to her left side.  She has seen physical therapy twice for this problem and reports she has been vertigo free for the past week.  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  Past history is negative for otitis media, ear surgery or head trauma.  She does not endorse hearing loss.She is scheduled for an audiogram on 11/02/16.      Review of Systems A complete 14 point review of systems was performed and all systems were negative except for the pertinent positives that are mentioned in the HPI.     Physical Exam   Constitutional: Vital signs are normal. She appears well-developed and well-nourished.   HENT:   Head: Normocephalic and atraumatic.   Right Ear: Tympanic membrane normal. No drainage. Tympanic membrane is not scarred, not perforated, not erythematous, not retracted and not bulging. No middle ear effusion. No decreased hearing is noted.   Left Ear: Tympanic membrane normal. No drainage. Tympanic membrane is not scarred, not perforated, not erythematous, not retracted and not bulging.  No middle ear effusion. No decreased hearing is noted.   Nose: No mucosal edema, nasal deformity or septal deviation. No epistaxis.   Mouth/Throat: Uvula is midline, oropharynx is clear and moist and mucous membranes are normal. She does not have dentures. No oral lesions. Normal dentition. No dental caries. No oropharyngeal exudate, posterior oropharyngeal edema or posterior oropharyngeal erythema.       Both ear canals are narrowed and there is occluding cerumen in the right external ear canal   Eyes: Conjunctivae, EOM and lids are normal. Right eye exhibits normal extraocular motion and no nystagmus. Left eye exhibits normal  extraocular motion and no nystagmus.   Neck: Carotid bruit is not present. No tracheal deviation present. No thyroid mass and no thyromegaly present.   Cardiovascular: Normal heart sounds.    No murmur heard.  Pulmonary/Chest: Breath sounds normal. She has no wheezes.   Lymphadenopathy:        Head (right side): No submental, no submandibular, no tonsillar, no preauricular and no posterior auricular adenopathy present.        Head (left side): No submental, no submandibular, no tonsillar, no preauricular and no posterior auricular adenopathy present.   Neurological: She is alert. No cranial nerve deficit.   Skin: Skin is warm and dry. No rash noted.   Psychiatric: Her speech is normal and behavior is normal.       Both ears were visualized with the otoscope.  All cerumen was successfully removed from right ear(s) by using the operating otoscope and curette.  Following removal of the cerumen the tympanic membrane(s) was (were) visualized with the otoscope and noted to be intact.   Dix-Hallpike positional testing was performed in the usual manner. No nystagmus was present when the wither ear was in the dependent position.     Plan: In my opinion Laurine BlazerBarbara [name] Coudriet has a diagnosis of cerumen in the right external ear canal and benign paroxysmal positional vertigo, left ear that has resolved.  She will return for and audiogram. .   cc: Ceridon, Clelia SchaumannSamuel J W, MD

## 2016-10-20 ENCOUNTER — Ambulatory Visit: Attending: Family Medicine | Admitting: Rehabilitative and Restorative Service Providers"

## 2016-10-20 DIAGNOSIS — R42 Dizziness and giddiness: Principal | ICD-10-CM

## 2016-10-20 NOTE — Progress Notes (Signed)
Physical Therapy Treatment Note: 10/20/2016   Time in 0956    Total # of visits: 4/6    Total Treatment Time: 25 min    Subjective: No sx of vertigo or nausea anymore. Ear is feeling a little better since Dr. Cheri Rous cleared it out.    Objective:   Patient seen for the following treatment:  Neuro re-ed x 25 min including   Gaze stabilization ex: X1 viewing in standing position with target 10 ft away. Pt standing on compliant surface. Busy background. Horizontal and vertical head movement. Performed with ft hip width apart and then with feet together. X2 viewing standing on compliant surface.    Balance training: High level balance/gait including walking with vertical and horizontal head turns. Standing on compliant surface EC, EC with head turns, EC with head turns with ft together   Habituation: Spinning in circles CW and CCW while walking down path in parallel bars  Reviewed home program    Goals:   1. Improved DHI score to <or=10/100. Met  2. Pt with improved gaze stabilization and able to perform X1 and X2 without difficulty. Met  3. Pt with improved balance and able to stand on foam with eyes closed with minimal sway. Met  4. Pt independent with a home exercise program. Met    Assessment/ Overall Progress: Patient performed advanced vestibular integration exercises well today. No longer having sx of nausea or vertigo. She has achieved her LTGs and is being discharged from PT.    Recommendations/Comments: Continue home exercise program.    Plan: Discharge patient from physical therapy at this time. Patient is to continue the home exercise program and to follow up with primary physician should any problems arise.    Irish Elders, PT, DPT  Physical Therapist

## 2016-11-02 ENCOUNTER — Ambulatory Visit (HOSPITAL_BASED_OUTPATIENT_CLINIC_OR_DEPARTMENT_OTHER): Payer: Self-pay | Admitting: Allergy & Immunology

## 2016-11-02 ENCOUNTER — Ambulatory Visit (HOSPITAL_BASED_OUTPATIENT_CLINIC_OR_DEPARTMENT_OTHER): Admitting: Audiologist-Hearing Aid Fitter

## 2016-11-02 DIAGNOSIS — Z0110 Encounter for examination of ears and hearing without abnormal findings: Principal | ICD-10-CM

## 2016-11-02 NOTE — Progress Notes (Signed)
C/o Vertigo that was relieved with Epley maneuvers at physical therapy. Occasional aural pressure at the right and, feels stuffy currently. No difficulties hearing. Occasional tinnitus lasting briefly.

## 2016-11-02 NOTE — Procedures (Signed)
See results in chart. Normal hearing acuity and symmetric. Poor ETF bilaterally with valsalva and toynbee. Normal type A tympanograms at the time of testing. No indication of outer, middle or inner ear disease process. Counseled regarding results. Discussed ETF and potential for allergies to cause dysfunction. Aural pressure secondary to poor ETF, TMJ or Sinus congestion? Recommended return if changes. Thank you for your kind referral.

## 2017-01-16 ENCOUNTER — Ambulatory Visit (HOSPITAL_BASED_OUTPATIENT_CLINIC_OR_DEPARTMENT_OTHER): Admitting: Family Medicine

## 2017-01-16 ENCOUNTER — Encounter (HOSPITAL_BASED_OUTPATIENT_CLINIC_OR_DEPARTMENT_OTHER): Payer: Self-pay | Admitting: Family Medicine

## 2017-01-16 VITALS — BP 108/74 | HR 65 | Temp 98.0°F | Resp 16 | Wt 145.9 lb

## 2017-01-16 DIAGNOSIS — M159 Polyosteoarthritis, unspecified: Secondary | ICD-10-CM

## 2017-01-16 DIAGNOSIS — M65331 Trigger finger, right middle finger: Secondary | ICD-10-CM

## 2017-01-16 DIAGNOSIS — H8110 Benign paroxysmal vertigo, unspecified ear: Principal | ICD-10-CM

## 2017-01-16 NOTE — Progress Notes (Signed)
HPI:  Rachel [name] is a 66yr-old female who is here today for a Routine HCM Appointment and follow up of Vertigo.    She has seen PT for treatment.  They have been able to trigger episodes during sessions.  She has seen Dr. Almeta Monas and Dr. Suzie Portela.  Their evaluation has revealed no concerns.  They have not been able to trigger episodes.    She reports that she has had one episode since that appointment with Dr. Almeta Monas.  It was "a mild one" that occurred during the night.  She was able to manage by sitting upright.    We discussed further management and prognosis.  She was given the opportunity to ask questions.      These questions were answered to her stated satisfaction.  As it seems to be improving, "watchful waiting" was the recommendation I gave her.  She verbalized understanding and a willingness to follow through with the plan of care.    She does not have other health concerns today and is otherwise doing well.  Denies any concerns over other chronic health conditions.  Denies any recent acute illnesses.    30 minutes was spent with this patient. Over 20 minutes was spent in coordination and counseling.     ROS:  Review of Systems was performed. Pertinent details discussed in HPI. Otherwise, denies any issues or other physical concerns    Vitals:  BP 108/74  Pulse 65  Temp 36.7 C (98 F) (Temporal)  Resp 16  Wt 66.2 kg (145 lb 14.4 oz)  SpO2 97%  BMI 25.85 kg/m2    Physical Exam   Constitutional: She is oriented to person, place, and time. She appears well-developed.   HENT:   Head: Normocephalic and atraumatic.   Eyes: Conjunctivae and EOM are normal. Pupils are equal, round, and reactive to light.   Neck: Normal range of motion.   Cardiovascular: Normal rate, regular rhythm and normal heart sounds.  Exam reveals no gallop and no friction rub.    No murmur heard.  Pulmonary/Chest: Effort normal and breath sounds normal. No respiratory distress. She has no wheezes. She has no rales.   Abdominal:  Soft. Bowel sounds are normal. She exhibits no distension and no mass. There is no tenderness.   Musculoskeletal: Normal range of motion.   Neurological: She is alert and oriented to person, place, and time.   Skin: Skin is warm and dry.   Psychiatric: She has a normal mood and affect. Her behavior is normal. Judgment and thought content normal.     Assessment and Plan:    ICD-10-CM    1. Benign paroxysmal positional vertigo, unspecified laterality H81.10    2. Osteoarthritis involving multiple joints on both sides of body M15.9    3. Trigger middle finger of right hand M65.331      This may take some time and repeated treatment sessions.  Return to PT if there is another episode.  If they continued to recur, then a return to EMT for further workup is warranted.      RTC 3 months for a Routine HCM Appointment - Vertigo, HPL, Arthritis.

## 2017-04-19 ENCOUNTER — Ambulatory Visit (HOSPITAL_BASED_OUTPATIENT_CLINIC_OR_DEPARTMENT_OTHER): Admitting: Family Medicine

## 2017-04-19 ENCOUNTER — Encounter (HOSPITAL_BASED_OUTPATIENT_CLINIC_OR_DEPARTMENT_OTHER): Payer: Self-pay | Admitting: Family Medicine

## 2017-04-19 VITALS — BP 122/72 | HR 73 | Temp 97.3°F | Resp 12 | Wt 151.2 lb

## 2017-04-19 DIAGNOSIS — M159 Polyosteoarthritis, unspecified: Secondary | ICD-10-CM

## 2017-04-19 DIAGNOSIS — H8110 Benign paroxysmal vertigo, unspecified ear: Principal | ICD-10-CM

## 2017-04-19 DIAGNOSIS — M810 Age-related osteoporosis without current pathological fracture: Secondary | ICD-10-CM

## 2017-04-19 DIAGNOSIS — E785 Hyperlipidemia, unspecified: Secondary | ICD-10-CM

## 2017-04-19 DIAGNOSIS — H6981 Other specified disorders of Eustachian tube, right ear: Secondary | ICD-10-CM

## 2017-04-19 DIAGNOSIS — K580 Irritable bowel syndrome with diarrhea: Secondary | ICD-10-CM

## 2017-04-19 NOTE — Progress Notes (Signed)
HPI:  Rachel [name] is a 66yr-old female who is here today for continued care and management of BPPV.    She has followed the plan of care as discussed during the last visit - visiting various specialists.  She reports that she has not had another episode since her last appointment with me.      Neither the ENT or the Audiologist found any findings of concern.  She took this to mean that no answers were found.    We discussed this.  I explained as their exams did not reveal any concerning findings, this is most likely BPPV.  Furthermore, as she has not experiencing any further episodes, PTs efforts as successfully treating her condition.    Throughout this conversation, she was given an opportunity to ask questions.  These were answered to their stated satisfaction.  She verbalized understanding and a willingness to follow through with the plan of care.    She does not have other health concerns.  Patient is otherwise doing well.  Denies any concerns over other chronic health conditions.  Denies any recent acute illnesses.  We discussed and made preparations for an upcoming Annual Exam.  She has been on [facility] since 01/2017.  So, this will be a Welcome to [facility] Exam.    ROS:  A Review of Systems was performed. Pertinent details discussed in HPI. Otherwise, denies any issues or other physical concerns.    Vital Signs:  BP 122/72 (SITE: left arm, Orthostatic Position: sitting, Cuff Size: regular)   Pulse 73   Temp 36.3 C (97.3 F) (Temporal)   Resp 12   Wt 68.6 kg (151 lb 3.2 oz)   SpO2 96%   BMI 26.78 kg/m     Physical Exam   Constitutional: She is oriented to person, place, and time. She appears well-developed.   HENT:   Head: Normocephalic and atraumatic.   Eyes: Conjunctivae and EOM are normal. Pupils are equal, round, and reactive to light.   Neck: Normal range of motion.   Cardiovascular: Normal rate, regular rhythm and normal heart sounds. Exam reveals no gallop and no friction rub.   No  murmur heard.  Pulmonary/Chest: Effort normal and breath sounds normal. No respiratory distress. She has no wheezes. She has no rales.   Abdominal: Soft. Bowel sounds are normal. She exhibits no distension and no mass. There is no tenderness.   Musculoskeletal: Normal range of motion.   Neurological: She is alert and oriented to person, place, and time.   Skin: Skin is warm and dry.   Psychiatric: She has a normal mood and affect. Her behavior is normal. Judgment and thought content normal.     Assessment and Plan:    ICD-10-CM    1. Benign paroxysmal positional vertigo, unspecified laterality H81.10    2. Chronic dysfunction of right eustachian tube H69.81    3. 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 Dyslipidemia E78.5 LIPID PANEL   5. Irritable bowel syndrome with diarrhea K58.0    6. 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

## 2017-04-30 ENCOUNTER — Ambulatory Visit
Admission: RE | Admit: 2017-04-30 | Discharge: 2017-04-30 | Disposition: A | Discharge status: Home / Self Care | Source: Ambulatory Visit | Attending: Family Medicine | Admitting: Family Medicine

## 2017-04-30 DIAGNOSIS — M810 Age-related osteoporosis without current pathological fracture: Principal | ICD-10-CM

## 2017-09-12 ENCOUNTER — Ambulatory Visit: Attending: Family Medicine

## 2017-09-12 DIAGNOSIS — M810 Age-related osteoporosis without current pathological fracture: Principal | ICD-10-CM

## 2017-09-12 DIAGNOSIS — E785 Hyperlipidemia, unspecified: Secondary | ICD-10-CM

## 2017-09-12 LAB — VITAMIN D, 25 HYDROXY: VITAMIN D, 25 HYDROXY: 36.8 ng/mL (ref 30–80)

## 2017-09-12 LAB — COMPREHENSIVE METABOLIC PANEL
ADJUSTED CALCIUM, MMC: 8.8 mg/dL (ref 8.7–10.2)
ALANINE TRANSFERASE (ALT): 24 U/L (ref 4–56)
ALB/GLOB RATIO_MMC: 1.4 (ref 1.0–1.6)
ALKALINE PHOSPHATASE (ALP): 49 U/L (ref 38–126)
ASPARTATE TRANSAMINASE (AST): 22 U/L (ref 9–44)
Albumin: 4.1 g/dL (ref 3.2–4.7)
BILIRUBIN TOTAL: 1 mg/dL (ref 0.1–2.2)
BUN/CREATININE_MMC: 25 — AB (ref 7.3–21.7)
CALCIUM: 8.9 mg/dL (ref 8.7–10.2)
CARBON DIOXIDE TOTAL: 27 mmol/L (ref 22–32)
CHLORIDE: 104 mmol/L (ref 99–109)
CREATININE BLOOD: 0.6 mg/dL (ref 0.50–1.30)
E-GFR, NON-AFRICAN AMERICAN: 110 mL/min/{1.73_m2} (ref 60–?)
E-GFR_MMC: 127 mL/min/{1.73_m2} (ref 60–?)
GLUCOSE: 93 mg/dL (ref 70–99)
Globulin: 3 g/dL (ref 2.2–4.2)
PROTEIN: 7.1 g/dL (ref 5.9–8.2)
Potassium: 3.7 mmol/L (ref 3.5–5.2)
SODIUM: 136 mmol/L (ref 134–143)
UREA NITROGEN, BLOOD (BUN): 15 mg/dL (ref 6–21)

## 2017-09-12 LAB — PHOSPHORUS (PO4): PHOSPHORUS (PO4): 2.8 mg/dL (ref 2.4–4.7)

## 2017-09-12 LAB — LIPID PANEL
CHOL HDL RATIO MMC: 2.9 mg/dL (ref 2.0–5.0)
CHOLESTEROL: 250 mg/dL — AB (ref 112–200)
HDL CHOLESTEROL: 85 mg/dL (ref 40–?)
LDL CHOLESTEROL CALCULATION: 151 mg/dL — AB (ref <100)
NON-HDL CHOLESTEROL: 165 mg/dL — AB (ref <=150.0)
Triglyceride Level: 71 mg/dL (ref 30–150)

## 2017-09-12 LAB — MAGNESIUM (MG): MAGNESIUM (MG): 2.1 mg/dL (ref 1.8–2.5)

## 2017-09-19 ENCOUNTER — Encounter (HOSPITAL_BASED_OUTPATIENT_CLINIC_OR_DEPARTMENT_OTHER): Payer: Self-pay | Admitting: Family Medicine

## 2017-09-19 ENCOUNTER — Ambulatory Visit (HOSPITAL_BASED_OUTPATIENT_CLINIC_OR_DEPARTMENT_OTHER): Admitting: Family Medicine

## 2017-09-19 VITALS — BP 132/64 | HR 69 | Temp 97.9°F | Resp 16 | Ht 63.78 in | Wt 148.9 lb

## 2017-09-19 DIAGNOSIS — Z7189 Other specified counseling: Secondary | ICD-10-CM

## 2017-09-19 DIAGNOSIS — Z23 Encounter for immunization: Secondary | ICD-10-CM

## 2017-09-19 DIAGNOSIS — M159 Polyosteoarthritis, unspecified: Secondary | ICD-10-CM

## 2017-09-19 DIAGNOSIS — M810 Age-related osteoporosis without current pathological fracture: Secondary | ICD-10-CM

## 2017-09-19 DIAGNOSIS — J301 Allergic rhinitis due to pollen: Secondary | ICD-10-CM

## 2017-09-19 DIAGNOSIS — Z0000 Encounter for general adult medical examination without abnormal findings: Principal | ICD-10-CM

## 2017-09-19 DIAGNOSIS — H6981 Other specified disorders of Eustachian tube, right ear: Secondary | ICD-10-CM

## 2017-09-19 DIAGNOSIS — Z7185 Encounter for immunization safety counseling: Secondary | ICD-10-CM

## 2017-09-19 DIAGNOSIS — K580 Irritable bowel syndrome with diarrhea: Secondary | ICD-10-CM

## 2017-09-19 DIAGNOSIS — E785 Hyperlipidemia, unspecified: Secondary | ICD-10-CM

## 2017-09-19 DIAGNOSIS — Z1239 Encounter for other screening for malignant neoplasm of breast: Secondary | ICD-10-CM

## 2017-09-19 DIAGNOSIS — Z1231 Encounter for screening mammogram for malignant neoplasm of breast: Secondary | ICD-10-CM

## 2017-09-19 DIAGNOSIS — Z1159 Encounter for screening for other viral diseases: Secondary | ICD-10-CM

## 2017-09-19 LAB — SNELLEN EYE TEST/VISUAL ACUITY

## 2017-09-19 MED ORDER — HEPATITIS A AND B VIRUS VACCINE(PF) 720 ELISA UNIT-20 MCG/ML IM SUSP
0.5000 mL | INTRAMUSCULAR | 2 refills | Status: DC
Start: 2017-09-19 — End: 2017-10-13

## 2017-09-19 MED ORDER — VARICELLA-ZOSTER GLYCOE VACC-AS01B ADJ(PF) 50 MCG/0.5 ML IM SUSP, KIT
0.5000 mL | INHALATION_SUSPENSION | INTRAMUSCULAR | 1 refills | Status: DC
Start: 2017-09-19 — End: 2017-10-13

## 2017-09-19 NOTE — Progress Notes (Signed)
HPI:  Rachel [name] is a 3yr yo female who is here today for a Welcome to [employer] Exam.    We reviewed PMH/PSH, family and social histories.  I obtained further information when needed and entered it into the EMR chart.    She has no immediate health concerns.      Patient obtained labs prior to this appointment. We reviewed and discussed the results in detail.     Lipid Panel remains abnormal, but with HDL at 85 mg/dL, she is receiving some protect benefit.  Furthermore, the TCh:HDL Ratio is at 2.9, indicating decent overall cardiac risk.    Vitamin D level improved and is now just within normal limits.  We discussed increasing supplementation.    The results otherwise revealed no concerns or signficant abnormalities.  They indicate that various health conditions are stable.     We did spend some time discussing Osteoporosis, associated the concerns as well as management options moving forward.  I recommended consideration for at least Fosamax, if not Prolia.    Throughout the discussion, she was given an opportunity to ask questions.  These were answered to her stated satisfaction.  She verbalized understanding and a willingness to follow through with the plan of care.    Patient is otherwise doing well.  Denies any concerns over other chronic health conditions.  Denies any recent acute illnesses.    ROS:  A Review of Systems was performed. Pertinent details discussed in HPI. Otherwise, denies any issues or other physical concerns.    Advanced Care Planning  We spent approximately 1-2 minutes and discussed End of Life Care as well as the decisions and planning associated with such care.  I assured the patient that such planning is voluntary and not a requirement to receive medical care.    She still has the documentation associated with such decisions as provided previously.  She has not completed them yet.    Routine [facility] provided by [facility].  - LMP:  ~2011  - Last  Pap/pelvic:  04/08/2015       - h/o abnormal exam:  No  - Last Mammogram:  06/28/2016       - h/o abnormal mammograms:  No    Preventative Healthcare and Screens    Cardiovascular Health  An EKG was performed today.  It revealed NSR.  Axises, wave intervals and wave morphology were all within expect parameters.    Pulmonary Health  Due to patient's smoking history, a Lung Cancer Screen (Low-dose Chest CT) is not appropriate.      Bone Health  - Last DEXA Scan:  04/30/2017  - LaResults:  Osteoporosis with a T-Score to -2.5    GI Health  - Last Colon Cancer Screening:  05/03/2012  - Last colonoscopy:  05/03/2012       - Monitoring schedule:  04/2022   - Hepatitis C Screen:  Refused    Ocular Health  - Routine visits with eye specialist:  Annual exams with glaucoma screens  We discussed the importance of routine eye care to monitor for conditions such as glaucoma and cataracts.    Dental Health   - Routine dental visits:  Semiannual cleanings.  Annual check ups with X-Rays   We discussed the importance for continued dental care.    Dermatologic Health  - Routine visits to a Dermatologist:  Established with Dr. No.  Routine skin checks performed.  We discussed the importance of regular skin checks -  both personal (with aid of family members) and by a medical professional.  We also discussed the need for skin protection from the sun.    Vaccinations:  - Flu vaccination:  Refused  - Pneumococcal vaccination:     - PPSV23:  Appropriate for next year    - PCV13:  Ordered today.  - Td/TdaP vaccination:  TdaP - 08/11/2015  - History of chicken pox:  Unsure  - History of Shingles Outbreak:  No  - Shingles Vaccination:  Zostavax 08/11/2015.  Rx provided for Shingrix.  - Hepatitis A/B Vaccination:  Discussed.  Rx provided.    The Welcome to [employer] Annual Checklist with preventative healthcare maintenance recommendations (as discussed above) was complete by me.  It was scanned into the chart and then provided to the patient for  review.    Vitals:  BP 132/64 (SITE: left arm, Orthostatic Position: sitting, Cuff Size: regular)   Pulse 69   Temp 36.6 C (97.9 F) (Temporal)   Resp 16   Ht 1.62 m (5' 3.78")   Wt 67.5 kg (148 lb 14.4 oz)   SpO2 93%   BMI 25.74 kg/m     Physical Exam   Constitutional: She is oriented to person, place, and time. She appears well-developed.   HENT:   Head: Normocephalic and atraumatic.   Eyes: Pupils are equal, round, and reactive to light. Conjunctivae and EOM are normal.   Neck: Normal range of motion.   Cardiovascular: Normal rate, regular rhythm and normal heart sounds. Exam reveals no gallop and no friction rub.   No murmur heard.  Pulmonary/Chest: Effort normal and breath sounds normal. No respiratory distress. She has no wheezes. She has no rales.   Abdominal: Soft. Bowel sounds are normal. She exhibits no distension and no mass. There is no tenderness.   Musculoskeletal: Normal range of motion.   Neurological: She is alert and oriented to person, place, and time.   Skin: Skin is warm and dry.   Psychiatric: She has a normal mood and affect. Her behavior is normal. Judgment and thought content normal.     Assessment and Plan:    ICD-10-CM    1. Medicare welcome exam Z00.00 POC ELECTROCARDIOGRAM WITH RHYTHM STRIP ([facility])     SNELLEN EYE TEST/VISUAL ACUITY   2. Counseling regarding end of life decision making Z71.89    3. Immunization counseling Z71.89    4. Dyslipidemia E78.5    5. 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 Chronic dysfunction of right eustachian tube H69.81    8. 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 Chronic seasonal allergic rhinitis due to pollen J30.1    10. Breast cancer screening Z12.31    11. Encounter for hepatitis C screening test for low risk patient Z11.59      RTC 1 Year for [employer] Annual Exam.  If you would like to consider starting Prolia, please contact clinic, we will start the Prior  Authorization process and order labs.

## 2017-09-19 NOTE — Patient Instructions (Addendum)
Denosumab (Prolia)  Pronunciation: den OH sue mab   Brand: Prolia   What is the most important information I should know about Prolia?  This medication guide provides information about the Prolia brand of denosumab. Delton See is another brand of denosumab used to prevent bone fractures and other skeletal conditions in people with tumors that have spread to the bone.   You should not receive denosumab if you are allergic to it, or if you have low levels of calcium in your blood (hypocalcemia).   Before you receive this medication, tell your doctor if you have kidney disease (or if you are on dialysis), a weak immune system, a history of hypoparathyroidism or thyroid surgery, a history of intestinal surgery, a condition that makes it hard for your body to absorb nutrients from food, or if you are allergic to latex.   Serious infections may occur during treatment with Prolia. Call your doctor right away if you have signs of infection such as: severe skin irritation; swelling or redness anywhere on your body; pain or burning when you urinate; severe stomach pain; ear pain, trouble hearing; cough, feeling short of breath; purple or red spots under your skin; or fever, chills, night sweats, flu symptoms, or weight loss.   Some people using denosumab have developed bone loss in the jaw, also called osteonecrosis of the jaw. Symptoms may include jaw pain, swelling, numbness, loose teeth, gum infection, or slow healing after injury or surgery involving the gums. You may be more likely to develop osteonecrosis of the jaw if you have cancer or have been treated with chemotherapy, radiation, or steroids. Other conditions associated with osteonecrosis of the jaw include blood clotting disorders, anemia (low red blood cells), and a pre-existing dental problem.   If you need to have any dental work (especially surgery), tell the dentist ahead of time that you are receiving denosumab. You may need to stop using the medicine for a short  time.   What is denosumab (Prolia)?  Denosumab is a monoclonal antibody. Monoclonal antibodies are made to target and destroy only certain cells in the body. This may help to protect healthy cells from damage.  The Prolia brand of denosumab is used to treat osteoporosis in postmenopausal women who have high risk of bone fracture.  Prolia is also used to increase bone mass in women and men with a high risk of bone fracture caused by receiving treatments for certain types of cancer.  This medication guide provides information about the Prolia brand of denosumab. Delton See is another brand of denosumab used to prevent bone fractures and other skeletal conditions in people with tumors that have spread to the bone.  Denosumab may also be used for purposes not listed in this medication guide.  What should I discuss with my health care provider before receiving Prolia?   You should not receive denosumab if you are allergic to it, or if you have low levels of calcium in your blood (hypocalcemia).   To make sure you can safely use Prolia, tell your doctor if you have any of these other conditions:   kidney disease (or if you are on dialysis);   a weak immune system (caused by disease or by using certain medicines);   a history of hypoparathyroidism (decreased functioning of the parathyroid glands);   a history of thyroid surgery;   a history of surgery to remove part of your intestine;   any condition that makes it hard for your body to absorb nutrients from  food (malabsorption); or   if you are allergic to latex.  Some people using denosumab have developed bone loss in the jaw, also called osteonecrosis of the jaw. Symptoms may include jaw pain, swelling, numbness, loose teeth, gum infection, or slow healing after injury or surgery involving the gums. You may be more likely to develop osteonecrosis of the jaw if you have cancer or have been treated with chemotherapy, radiation, or steroids. Other conditions associated  with osteonecrosis of the jaw include blood clotting disorders, anemia (low red blood cells), and a pre-existing dental problem.   FDA pregnancy category C. It is not known whether denosumab will harm an unborn baby. Tell your doctor if you are pregnant or plan to become pregnant while using this medication.   If you are pregnant, your name may be listed on a pregnancy registry. This is to track the outcome of the pregnancy and to evaluate any effects of denosumab on the baby.   It is not known whether denosumab passes into breast milk or if it could harm a nursing baby. However, this medication may slow the production of breast milk. You should not breast-feed while receiving denosumab.   How is Prolia given?  Denosumab is injected under the skin of your stomach, upper thigh, or upper arm. A healthcare provider will give you this injection.  Prolia is usually given once every 6 months. Follow your doctor's instructions.  Your doctor may have you take extra calcium and vitamin D while you are being treated with denosumab. Take only the amount of calcium and vitamin D that your doctor has prescribed.  Pay special attention to your dental hygiene. Brush and floss your teeth regularly while receiving this medication. You may need to have a dental exam before you begin treatment with Prolia. Follow your doctor's instructions.   If you need to have any dental work (especially surgery), tell the dentist ahead of time that you are receiving denosumab. You may need to stop using the medicine for a short time.    If you keep your medication at home, store it in the original container in a refrigerator. Protect from light and do not freeze.   You may take the medicine out of the refrigerator and allow it to reach room temperature before the injection is given. Do not heat the medicine before using.   After you have taken Prolia out of the refrigerator, you may keep it at room temperature for up to 14 days. Store in the  original container away from heat and light.    Do not shake the prefilled syringe or you may ruin the medicine. Do not use the medication if it looks cloudy or has particles in it. Call your doctor for a new prescription.   Each prefilled syringe of this medicine is for one use only. Throw away after one use, even if there is still some medicine left in it after injecting your dose.  Do not share this medication with another person, even if they have the same symptoms you have.  What happens if I miss a dose?  Call your doctor for instructions if you miss an appointment for your Prolia injection. You should receive your missed injection as soon as possible.  What happens if I overdose?   Seek emergency medical attention or call the Poison Help line at [phone].   What should I avoid while receiving Prolia?  Follow your doctor's instructions about any restrictions on food, beverages, or activity.  What  are the possible side effects of Prolia?   Get emergency medical help if you have any of these signs of an allergic reaction: hives; difficulty breathing; swelling of your face, lips, tongue, or throat.    Serious infections may occur during treatment with Prolia. Call your doctor right away if you have signs of infection such as:    severe itching, burning, rash, blistering, peeling, or dryness of the skin;   swelling, pain, tenderness, warmth, or redness anywhere on your body;   pain or burning when you urinate, blood in your urine;   severe stomach pain;   ear pain or drainage, trouble hearing;   fever, chills, night sweats;   cough, feeling short of breath;   pinpoint purple or red spots under your skin; or   flu symptoms, weight loss.   Call your doctor at once if you have a serious side effect such as:    numbness or tingly feeling around your mouth or in your fingers or toes, fast or slow heart rate, muscle cramps or contraction, overactive reflexes; or   severe pain in your upper stomach  spreading to your back, nausea and vomiting, fast heart rate.  Less serious side effects may include:   weakness;   constipation;   back pain, muscle pain; or   pain in your arms or legs.  This is not a complete list of side effects and others may occur. Call your doctor for medical advice about side effects. You may report side effects to FDA at 1-800-FDA-1088.  What other drugs will affect Prolia?  Tell your doctor about all other medicines you use, especially drugs that weaken your immune system such as:   steroids or cancer medicine;   cyclosporine (Neoral, Sandimmune, Gengraf);   sirolimus (Rapamune), tacrolimus (Prograf);   basiliximab (Simulect), muromonab-CD3 (Orthoclone);   mycophenolate mofetil (CellCept); or   azathioprine (Imuran), leflunomide (Arava), etanercept (Enbrel).  This list is not complete and other drugs may interact with denosumab. Tell your doctor about all medications you use. This includes prescription, over-the-counter, vitamin, and herbal products. Do not start a new medication without telling your doctor.  Where can I get more information?  Your doctor or pharmacist can provide more information about denosumab (Prolia).    Remember, keep this and all other medicines out of the reach of children, never share your medicines with others, and use this medication only for the indication prescribed.  Every effort has been made to ensure that the information provided by [employer] ('[employer]') is accurate, up-to-date, and complete, but no guarantee is made to that effect. Drug information contained herein may be time sensitive. Mult[employer] information has been compiled for use by healthcare practitioners and consumers in the [place] and therefore [employer] does not warrant that uses outside of the [place] are appropriate, unless specifically indicated otherwise. Mult[employer]'s drug information does not endorse drugs, diagnose patients or recommend therapy. Mult[employer]'s drug  information is an informational resource designed to assist licensed healthcare practitioners in caring for their patients and/or to serve consumers viewing this service as a supplement to, and not a substitute for, the expertise, skill, knowledge and judgment of healthcare practitioners. The absence of a warning for a given drug or drug combination in no way should be construed to indicate that the drug or drug combination is safe, effective or appropriate for any given patient. Mult[employer] does not assume any responsibility for any aspect of healthcare administered with the aid of information Mult[employer] provides. The   information contained herein is not intended to cover all possible uses, directions, precautions, warnings, drug interactions, allergic reactions, or adverse effects. If you have questions about the drugs you are taking, check with your doctor, nurse or pharmacist.  Copyright [id number] [employer] Version: 8.01. Revision date: 03/04/2010.  This information does not replace the advice of a doctor. [employer] disclaims any warranty or liability for your use of this information.   Content Version: 9.7.145117          pneumococcal 13-valent conjugate vaccine  Pronunciation: NOO moe KOK al 13-VAY lent KON joo gate VAX een   Brand: Prevnar 13   What is the most important information I should know about this vaccine?  For children, the pneumococcal 13-valent vaccine is given in a series of shots. The first shot is usually given when the child is 2 months old. The booster shots are then given at 4 months, 6 months, and 57 to 41 months of age. Adults usually receive only one dose of the vaccine.  In a child older than 6 months who has not yet received this vaccine, the first dose can be given any time from the age of 7 months through 5 years (before the 46th birthday).  If the child is less than 23 year old at the time of the first shot, he or she will need 2 booster doses. If the child is 35 to 71  months old at the time of the first shot, he or she will need 1 booster dose. A child who is 2 years or older at the time of the first shot may need only the one shot and no booster doses.   The timing of this vaccination is very important for it to be effective. Your child's individual booster schedule may be different from these guidelines. Follow your doctor's instructions or the schedule recommended by the health department of the state you live in.    Keep track of any and all side effects your child has after receiving this vaccine. When the child receives a booster dose, you will need to tell the doctor if the previous shot caused any side effects.   You can still receive a vaccine if you have a minor cold. In the case of a more severe illness with a fever or any type of infection, wait until you get better before receiving this vaccine.  Becoming infected with pneumococcal disease (such as pneumonia or meningitis) is much more dangerous to your health than receiving this vaccine. However, like any medicine, this vaccine can cause side effects but the risk of serious side effects is extremely low.   Be sure to keep your child on a regular schedule for other immunizations against diseases such as diphtheria, tetanus, pertussis (whooping cough), measles, mumps, hepatitis, or varicella (chicken pox). Your doctor or state health department can provide you with a recommended immunization schedule.   What is pneumococcal 13-valent conjugate vaccine?  Pneumococcal disease is a serious infection caused by a bacteria. Pneumococcal bacteria can infect the sinuses and inner ear. It can also infect the lungs, blood, and brain, and these conditions can be fatal.  Pneumococcal 13-valent vaccine is used to prevent infection caused by pneumococcal bacteria. This vaccine contains 13 different types of pneumococcal bacteria.  Pneumococcal 13-valent vaccine works by exposing you to a small amount of the bacteria or a protein  from the bacteria, which causes the body to develop immunity to the disease. This vaccine will not treat  an active infection that has already developed in the body.   Pneumococcal 13-valent vaccine is for use in children from 6 weeks to 18 years old, and in adults who are 18 and older.   Becoming infected with pneumococcal disease (such as pneumonia or meningitis) is much more dangerous to your health than receiving this vaccine. However, like any medicine, this vaccine can cause side effects but the risk of serious side effects is extremely low.  Like any vaccine, pneumococcal 13-valent vaccine may not provide protection from disease in every person.  What should I discuss with my healthcare provider before receiving this vaccine?   Keep track of any and all side effects your child has after receiving this vaccine. When the child receives a booster dose, you will need to tell the doctor if the previous shot caused any side effects.    You should not receive this vaccine if you ever had a severe allergic reaction to a pneumococcal or diphtheria vaccine.   Before your child receives this vaccine, tell your doctor if the child was born prematurely.  To make sure you or your child can safely receive this vaccine, tell your doctor if you or your child have any of these other conditions:   a bleeding or blood clotting disorder such as hemophilia or easy bruising; or   a weak immune system caused by disease, bone marrow transplant, or by using certain medicines or receiving cancer treatments.  You can still receive a vaccine if you have a minor cold. In the case of a more severe illness with a fever or any type of infection, wait until you get better before receiving this vaccine.  How is this vaccine given?  This vaccine is injected into a muscle. You will receive this injection in a doctor's office or clinic setting.  For children, the pneumococcal 13-valent vaccine is given in a series of shots. The first shot is  usually given when the child is 2 months old. The booster shots are then given at 4 months, 6 months, and 30 to 44 months of age. Adults usually receive only one dose of the vaccine.  The first injection should be given no earlier than 37 weeks of age. Allow at least 2 months to pass between injections.  If your child is older than 6 months, he or she can still receive this vaccine on the following schedule:   Age 26-11 months: two injections at least 4 weeks apart, followed by a third injection after the child turns 1 year (at least 2 months after the second injection);   Age 50-23 months: two injections at least 2 months apart;   Age 74 months to 5 years (before the 53th birthday): one injection.   The timing of this vaccination is very important for it to be effective. Your child's individual booster schedule may be different from these guidelines. Follow your doctor's instructions or the schedule recommended by the health department of the state you live in.   Your doctor may recommend treating fever and pain with an aspirin-free pain reliever such as acetaminophen (Tylenol) or ibuprofen (Motrin, Advil, and others) when the shot is given and for the next 24 hours. Follow the label directions or your doctor's instructions about how much of this medicine to give your child.  It is especially important to prevent fever from occurring in a child who has a seizure disorder such as epilepsy.   Be sure to keep your child on a regular schedule  for other immunizations such as diphtheria, tetanus, pertussis (whooping cough), hepatitis, and varicella (chicken pox). Your doctor or state health department can provide you with a recommended immunization schedule.   What happens if I miss a dose?  Contact your doctor if your child will miss a booster dose or gets behind schedule. The next dose should be given as soon as possible. There is no need to start over.  Be sure your child receives all recommended doses of this  vaccine. If your child does not receive the full series of vaccines, he or she may not be fully protected against the disease.  What happens if I overdose?  An overdose of this vaccine is unlikely to occur.  What should I avoid before or after receiving this vaccine?  Follow your doctor's instructions about any restrictions on food, beverages, or activity.  What are the possible side effects of this vaccine?   Your child should not receive a booster vaccine if he or she had a life-threatening allergic reaction after the first shot.    Keep track of any and all side effects your child has after receiving this vaccine. When the child receives a booster dose, you will need to tell the doctor if the previous shot caused any side effects.    Get emergency medical help if your child has any of these signs of an allergic reaction: hives; difficulty breathing; swelling of the face, lips, tongue, or throat.    Call your doctor at once if you or your child has a serious side effect such as:    high fever (103 degrees or higher);   seizure (convulsions);   wheezing, trouble breathing;   severe stomach pain, severe vomiting or diarrhea;   easy bruising or bleeding; or   severe pain, itching, irritation, or skin changes where the shot was given.  Less serious side effects include   crying, fussiness;   headache, tired feeling;   muscle or joint pain;   drowsiness, sleeping more or less than usual;   mild redness, swelling, tenderness, or a hard lump where the shot was given;   loss of appetite, mild vomiting or diarrhea;   low fever (102 degrees or less), chills; or   mild skin rash.  This is not a complete list of side effects and others may occur. Call your doctor for medical advice about side effects. You may report vaccine side effects to the [place] Department of Health and Human Services at [phone].  What other drugs will affect this vaccine?   Before receiving this vaccine, tell the doctor about all other  vaccines you or your child have recently received.   Also tell the doctor if you or your child have recently received drugs or treatments that can weaken the immune system, including:   an oral, nasal, inhaled, or injectable steroid medicine;   chemotherapy or radiation;   medications to treat psoriasis, rheumatoid arthritis, or other autoimmune disorders, such as azathioprine (Imuran), etanercept (Enbrel), leflunomide (Arava), and others; or   medicines to treat or prevent organ transplant rejection, such as basiliximab (Simulect), cyclosporine (Sandimmune, Neoral, Gengraf), muromonab CD3 (Orthoclone), mycophenolate mofetil (CellCept), sirolimus (Rapamune), or tacrolimus (Prograf).  If you are using any of these medications, you may not be able to receive the vaccine, or may need to wait until the other treatments are finished.  There may be other drugs that can interact with pneumococcal 13-valent vaccine. Tell your doctor about all medications you use. This includes prescription, over-the-counter,  vitamin, and herbal products. Do not start a new medication without telling your doctor.  Where can I get more information?  Your doctor or pharmacist can provide more information about this vaccine. Additional information is available from your local health department or the Centers for Disease Control and Prevention.    Remember, keep this and all other medicines out of the reach of children, never share your medicines with others, and use this medication only for the indication prescribed.  Every effort has been made to ensure that the information provided by [employer] ('[employer]') is accurate, up-to-date, and complete, but no guarantee is made to that effect. Drug information contained herein may be time sensitive. [employer] information has been compiled for use by healthcare practitioners and consumers in the [place] and therefore [employer] does not warrant that uses outside of the [place] are  appropriate, unless specifically indicated otherwise. Mult[employer]'s drug information does not endorse drugs, diagnose patients or recommend therapy. Mult[employer]'s drug information is an [employer] to assist licensed healthcare practitioners in caring for their patients and/or to serve consumers viewing this service as a supplement to, and not a substitute for, the expertise, skill, knowledge and judgment of healthcare practitioners. The absence of a warning for a given drug or drug combination in no way should be construed to indicate that the drug or drug combination is safe, effective or appropriate for any given patient. Mult[employer] does not assume any responsibility for any aspect of healthcare administered with the aid of information Mult[employer] provides. The information contained herein is not intended to cover all possible uses, directions, precautions, warnings, drug interactions, allergic reactions, or adverse effects. If you have questions about the drugs you are taking, check with your doctor, nurse or pharmacist.  Copyright [phone] [employer] Version: 4.01. Revision date: 06/06/2010.  This information does not replace the advice of a doctor. [employer] disclaims any warranty or liability for your use of this information.   Content Version: 9.7.145117

## 2017-10-09 ENCOUNTER — Telehealth (HOSPITAL_BASED_OUTPATIENT_CLINIC_OR_DEPARTMENT_OTHER): Payer: Self-pay | Admitting: Family Medicine

## 2017-10-09 DIAGNOSIS — R300 Dysuria: Principal | ICD-10-CM

## 2017-10-09 NOTE — Telephone Encounter (Signed)
Thinks she has a UTI.  Blood in urine and frequency.  Can she get a lab order to drop off a sample?

## 2017-10-10 ENCOUNTER — Ambulatory Visit: Attending: Family

## 2017-10-10 DIAGNOSIS — R300 Dysuria: Principal | ICD-10-CM

## 2017-10-10 LAB — URINALYSIS AND CULTURE IF IND
BILIRUBIN URINE: NEGATIVE
GLUCOSE URINE: NEGATIVE mg/dL
KETONES_MMC: NEGATIVE
NITRITE URINE: NEGATIVE
PH URINE: 6 (ref 5.0–9.0)
PROTEIN URINE: NEGATIVE mg/dL
RBC: 3 /HPF (ref <=3)
SPECIFIC GRAVITY URINE MMC: 1.004 (ref 1.002–1.030)
UROBILINOGEN.: NEGATIVE mg/dL
WBC: 2 /HPF (ref <5)

## 2017-10-10 NOTE — Telephone Encounter (Signed)
Ordered

## 2017-10-10 NOTE — Telephone Encounter (Signed)
Spoke with patient. Patient verbalizes understanding order has been submitted drop by office or lab to complete.

## 2017-10-11 NOTE — Telephone Encounter (Signed)
UA was unremarkable for concerns of infection.

## 2017-10-11 NOTE — Telephone Encounter (Signed)
Received VM from patient. Patient wanted to know if her UA showed anything and if anything was going to be ordered to local pharmacy. States she is in town currently.

## 2017-10-12 NOTE — Telephone Encounter (Signed)
Pt states she is still having signs and symptoms of a UTI. Her pharmacy is [facility] in [place]

## 2017-10-12 NOTE — Telephone Encounter (Signed)
Pt informed to call urgent care in the morning to schedule appt there is no available appointments this after noon.

## 2017-10-12 NOTE — Telephone Encounter (Signed)
This may be something else, could you make her an apt?

## 2017-10-13 ENCOUNTER — Other Ambulatory Visit: Attending: NURSE PRACTITIONER

## 2017-10-13 ENCOUNTER — Ambulatory Visit (HOSPITAL_BASED_OUTPATIENT_CLINIC_OR_DEPARTMENT_OTHER)

## 2017-10-13 ENCOUNTER — Encounter (HOSPITAL_BASED_OUTPATIENT_CLINIC_OR_DEPARTMENT_OTHER): Payer: Self-pay

## 2017-10-13 VITALS — BP 132/78 | HR 69 | Resp 16 | Wt 149.5 lb

## 2017-10-13 DIAGNOSIS — R300 Dysuria: Secondary | ICD-10-CM

## 2017-10-13 DIAGNOSIS — N3001 Acute cystitis with hematuria: Principal | ICD-10-CM

## 2017-10-13 LAB — POC URINALYSIS-DIPSTICK
POC BILIRUBIN URINE: NEGATIVE
POC GLUCOSE URINE: NEGATIVE mg/dL
POC NITRITE URINE: NEGATIVE
POC PH URINALYSIS: 5 pH (ref 4.8–7.8)
POC SP GRAVITY: 1.015 (ref 1.002–1.030)
POC UROBILINOGEN: 0.2 EU/dL

## 2017-10-13 MED ORDER — CIPROFLOXACIN 500 MG TABLET
500.0000 mg | ORAL_TABLET | Freq: Two times a day (BID) | ORAL | 0 refills | Status: AC
Start: 2017-10-13 — End: 2017-10-18

## 2017-10-13 NOTE — Patient Instructions (Signed)
Push fluids, return to ER for any worsening of symptoms

## 2017-10-13 NOTE — Progress Notes (Signed)
HPI    Pt here for uti symptoms x 5 days;   pressure, urgency, mild flank pain, not acute in nature.  Blood seen in urine intermittently, no blood was seen today by patient. No fever.She has been drinking a lot of water.     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 Negative for chills and fever.   Gastrointestinal: Negative for nausea.   Genitourinary: Positive for dysuria, flank pain, frequency and hematuria.   All other systems reviewed and are negative.   , no fever, no nausea        Physical Exam   Constitutional: She is oriented to person, place, and time. She appears well-developed and well-nourished.   Healthy, non toxic appearing    Cardiovascular: Normal rate.   Pulmonary/Chest: Effort normal.   Abdominal:   Mild right cva ttp    Neurological: She is oriented to person, place, and time.   Psychiatric: She has a normal mood and affect.               Current Outpatient Medications on File Prior to Visit   Medication Sig Dispense Refill    CALCIUM CARBONATE (CALCIUM 500 PO) Take 500 mg by mouth every day.      diphenhydramine HCl (ANTIHISTAMINE PO)       FLAX SEED OIL 1,000 mg Capsule Take 1,000 mg by mouth every day.      IBUPROFEN PO Take by mouth.      LACTOBACILLUS ACIDOPHILUS (PROBIOTIC PO) Take 1 capsule by mouth 2 times daily.      MULTIVITAMIN PO Take 1 tablet by mouth every day.       No current facility-administered medications on file prior to visit.        BP 132/78 (SITE: left arm, Orthostatic Position: sitting, Cuff Size: regular)   Pulse 69   Resp  16   Wt 67.8 kg (149 lb 8 oz)   SpO2 96%   BMI 25.84 kg/m     Plan/ start abx with renal coverage- cipro  Culture urine  Discussed ER if worsening symptoms  Discussed renal ultrasound or renal ct for worsening symptoms

## 2017-10-15 LAB — CULTURE URINE, BACTI

## 2018-05-07 ENCOUNTER — Encounter (HOSPITAL_BASED_OUTPATIENT_CLINIC_OR_DEPARTMENT_OTHER): Payer: Self-pay | Admitting: Family Medicine

## 2018-05-07 ENCOUNTER — Ambulatory Visit (HOSPITAL_BASED_OUTPATIENT_CLINIC_OR_DEPARTMENT_OTHER): Admitting: Family Medicine

## 2018-05-07 VITALS — BP 126/60 | HR 77 | Temp 98.5°F | Resp 16 | Wt 152.3 lb

## 2018-05-07 DIAGNOSIS — E785 Hyperlipidemia, unspecified: Secondary | ICD-10-CM

## 2018-05-07 DIAGNOSIS — E559 Vitamin D deficiency, unspecified: Secondary | ICD-10-CM

## 2018-05-07 DIAGNOSIS — M810 Age-related osteoporosis without current pathological fracture: Secondary | ICD-10-CM

## 2018-05-07 DIAGNOSIS — Z1231 Encounter for screening mammogram for malignant neoplasm of breast: Secondary | ICD-10-CM

## 2018-05-07 DIAGNOSIS — J0100 Acute maxillary sinusitis, unspecified: Principal | ICD-10-CM

## 2018-05-07 MED ORDER — LACTOBACILLUS COMBINATION NO.10 20 BILLION CELL CAPSULE
1.0000 | ORAL_CAPSULE | Freq: Every day | ORAL | 11 refills | Status: AC
Start: 2018-05-07 — End: 2019-05-02

## 2018-05-07 MED ORDER — DEXTROMETHORPHAN-GUAIFENESIN 5 MG-100 MG/5 ML ORAL LIQUID
20.0000 mL | ORAL | Status: AC | PRN
Start: 2018-05-07 — End: 2019-05-02

## 2018-05-07 MED ORDER — AMOXICILLIN-POTASSIUM CLAVULANATE 1,000 MG-62.5 MG TABLET,EXT.REL 12HR
1.0000 | EXTENDED_RELEASE_CAPSULE | Freq: Two times a day (BID) | ORAL | 0 refills | Status: AC
Start: 2018-05-07 — End: 2018-05-17

## 2018-05-07 NOTE — Progress Notes (Signed)
HPI:  Rachel [name] is a [age]yr-old female who is here today for signs and symptoms indicative of a URI.    She reports fevers; chills; fatigue; malaise; headaches; sinus/ear pain over the past 1-2 days after 7, or so days of sinus/nasal congestion.  She also is experiencing a wet cough productive of thick mucous.      She is not experiencing improvement with OTC medications.  She feels worse when she lies down at night.    She has no other immediate/new health concerns.  We discussed her chronic medical issues.  Patient is otherwise doing well.  Denies any concerns over other chronic health conditions.  Denies any recent acute illnesses.    ROS:  A Review of Systems was performed. Pertinent details discussed in HPI. Otherwise, ROS is negative.    Vitals:  BP 126/60 (SITE: left arm, Orthostatic Position: sitting, Cuff Size: regular)   Pulse 77   Temp 36.9 C (98.5 F) (Temporal)   Resp 16   Wt 69.1 kg (152 lb 4.8 oz)   SpO2 98%   BMI 26.32 kg/m     Physical Exam  Constitutional:       Appearance: She is well-developed.   HENT:      Head: Normocephalic and atraumatic.      Right Ear: Hearing, ear canal and external ear normal. A middle ear effusion is present.      Left Ear: Hearing, ear canal and external ear normal. A middle ear effusion is present.      Nose: Mucosal edema, congestion and rhinorrhea present.      Right Sinus: No maxillary sinus tenderness or frontal sinus tenderness.      Left Sinus: Maxillary sinus tenderness present. No frontal sinus tenderness.      Mouth/Throat:      Lips: No lesions.      Mouth: No oral lesions.      Dentition: Normal dentition.      Pharynx: Uvula midline. Pharyngeal swelling, oropharyngeal exudate and posterior oropharyngeal erythema present. No uvula swelling.      Tonsils: No tonsillar exudate or tonsillar abscesses. Swelling: 0 on the right. 0 on the left.   Eyes:      General: Lids are normal.      Conjunctiva/sclera: Conjunctivae normal.      Right eye:  Right conjunctiva is not injected.      Left eye: Left conjunctiva is not injected.      Pupils: Pupils are equal, round, and reactive to light.   Neck:      Musculoskeletal: Full passive range of motion without pain, normal range of motion and neck supple. No edema, erythema, neck rigidity or muscular tenderness.      Thyroid: No thyroid mass or thyromegaly.      Vascular: No carotid bruit, hepatojugular reflux or JVD.      Trachea: No tracheal tenderness or tracheal deviation.   Cardiovascular:      Rate and Rhythm: Normal rate and regular rhythm.      Heart sounds: Normal heart sounds. No murmur. No friction rub. No gallop.    Pulmonary:      Effort: Pulmonary effort is normal. No respiratory distress.      Breath sounds: Normal breath sounds. No wheezing or rales.   Abdominal:      General: Bowel sounds are normal. There is no distension.      Palpations: Abdomen is soft. There is no mass.      Tenderness: There  is no abdominal tenderness.   Musculoskeletal: Normal range of motion.   Lymphadenopathy:      Head:      Right side of head: No submental, submandibular, tonsillar, preauricular, posterior auricular or occipital adenopathy.      Left side of head: No submental, submandibular, tonsillar, preauricular, posterior auricular or occipital adenopathy.      Cervical: No cervical adenopathy.   Skin:     General: Skin is warm and dry.   Neurological:      Mental Status: She is alert and oriented to person, place, and time.   Psychiatric:         Speech: Speech normal.         Behavior: Behavior normal.         Thought Content: Thought content normal.         Judgment: Judgment normal.     Assessment and Plan:    ICD-10-CM    1. Acute non-recurrent maxillary sinusitis J01.00 Amoxicillin-Clavulanate (AUGMENTIN XR) 1,000-62.5 mg per tablet     Dextromethorphan-Guaifenesin (ROBITUSSIN COUGH-CHEST CONG DM) 5-100 mg/5 mL Liquid     lactobacillus comb no.10 (PROBIOTIC) 20 billion cell Capsule   2. Senile osteoporosis  M81.0 COMPREHENSIVE METABOLIC PANEL     VITAMIN D, 25 HYDROXY   3. Dyslipidemia E78.5 LIPID PANEL   4. Vitamin D deficiency E55.9 VITAMIN D, 25 HYDROXY   5. Breast cancer screening by mammogram Z12.31 [facility] MAMMOGRAM BREAST SCREENING     Besides the above, patient is to OTC nonsteroidals PRN, prescription for symptomatic treatment as written, vaporizer, fluids and rest and heat application to sinuses    RTC in 5 Months for a [facility] Annual Exam.

## 2018-07-01 ENCOUNTER — Ambulatory Visit

## 2018-07-16 ENCOUNTER — Ambulatory Visit (HOSPITAL_BASED_OUTPATIENT_CLINIC_OR_DEPARTMENT_OTHER): Admitting: Family Medicine

## 2018-07-16 ENCOUNTER — Encounter (HOSPITAL_BASED_OUTPATIENT_CLINIC_OR_DEPARTMENT_OTHER): Payer: Self-pay | Admitting: Family Medicine

## 2018-07-16 VITALS — BP 132/80 | HR 77 | Temp 97.5°F | Resp 16 | Wt 154.5 lb

## 2018-07-16 DIAGNOSIS — J069 Acute upper respiratory infection, unspecified: Principal | ICD-10-CM

## 2018-07-16 MED ORDER — BENZONATATE 100 MG CAPSULE
200.0000 mg | ORAL_CAPSULE | Freq: Three times a day (TID) | ORAL | 0 refills | Status: AC | PRN
Start: 2018-07-16 — End: 2018-08-15

## 2018-07-16 MED ORDER — FLUTICASONE PROPIONATE 50 MCG/ACTUATION NASAL SPRAY,SUSPENSION
2.0000 | Freq: Every day | NASAL | 6 refills | Status: DC
Start: 2018-07-16 — End: 2020-08-09

## 2018-07-16 NOTE — Progress Notes (Signed)
HPI:  1. She has been sick for about 2 days now.  She has a cough, stuffy nose, mild runny nose, facial pain.  She denies myalgias, wheezing, dyspnea or fevers.  OTC meds have helped some.      Past Medical, Social, Family, Surgical histories, Meds and Allergies reviewed.    Review of Systems      Physical Exam  Constitutional:       General: She is not in acute distress.     Appearance: She is not diaphoretic.   HENT:      Head: Normocephalic and atraumatic.      Right Ear: External ear normal.      Left Ear: External ear normal.      Nose: Nose normal.      Mouth/Throat:      Pharynx: No oropharyngeal exudate.   Eyes:      General: No scleral icterus.        Right eye: No discharge.         Left eye: No discharge.      Conjunctiva/sclera: Conjunctivae normal.   Cardiovascular:      Rate and Rhythm: Normal rate and regular rhythm.      Heart sounds: Normal heart sounds. No murmur. No friction rub. No gallop.    Pulmonary:      Effort: Pulmonary effort is normal. No respiratory distress.      Breath sounds: Normal breath sounds. No wheezing or rales.   Skin:     General: Skin is warm and dry.      Findings: No erythema.   Neurological:      Mental Status: She is alert and oriented to person, place, and time.      Gait: Gait is intact.   Psychiatric:         Mood and Affect: Mood and affect normal.           ICD-10-CM    1. Upper respiratory tract infection, unspecified type J06.9      She doesn't need any antibiotics at this time.  She can call back in about 8 days if she isn't improving and I can send something in.

## 2018-07-16 NOTE — Patient Instructions (Signed)
We discussed that this illness is likely a virus which doesn't require antibiotics.  Take cough meds as discussed.  Also use a humidifier, vicks, nsaids (ibuprofen, motrin, aleve and advil), tylenol, fluids, warm tea with honey and lemon, warm chicken noodle soup, rest and fluids.  Call back if no better in a week or if worsening.      Chicken Soup   Researchers found that chicken soup was most effective in reducing upper respiratory infections, but low-sodium soup carries great nutritional value and helps keep you hydrated.     Ginger   The health benefits of ginger root have been used for centuries, but now they have proof of its curative properties. A few slices of raw ginger root in some boiling water may help soothe a cough or a sore throat and ward off the feelings of nausea that so often accompany influenza.     Honey   Honey in tea with lemon can ease sore throat pain, but new evidence reveals that honey is an effective cough suppressant. A recent study published in Pediatrics revealed that 10 grams of honey given at bedtime reduced the severity of cough symptoms in children and helped them sleep more soundly, which also helped reduce cold symptoms.   Honey has a variety of antibacterial and antimicrobial properties.   Never administer honey to a child younger than one year. Honey often contains botulinum spores, and while this is harmless to adults and older children, infants' immune systems are not able to fight off the bacteria.     Garlic   Adding a garlic supplement to your diet might not only reduce the severity of cold symptoms, it may also keep you from getting sick in the first place. Garlic contains the compound allicin, which blocks the enzymes that contribute to the development of a variety of bacterial and viral infections.      Echinacea   Native Americans have used the herb and root of the Echinacea plant to treat infections for more than 400 years. Recent studies also support that herb's  effectiveness at reducing the duration of common cold and flu symptoms. Its active ingredients include flavonoids, chemicals that have many therapeutic effects on the body, including the ability to boost the immune system and reduce inflammation. Otherwise healthy adults may take 1-2 grams of Echinacea root or herb as a tea three times daily, but not for longer than one week.     Vitamin C   The many health benefits of vitamin C means you should always get enough lemons, limes, oranges, and greens in your diet. Adding fresh lemon juice to hot tea with honey or hydrating with hot or cold lemonade can help to reduce phlegm. Studies report that including at least 500 mgs of vitamin C in your daily diet boosts the body's immune system, therefore reducing exposure to infection. In the study, people under stress experienced the greatest health benefits from vitamin C.     Probiotics   Recent studies in [place] have suggested that healthy adults who regularly ingest the probiotics commonly found in yogurt may reduce their chances of developing respiratory infections by up to 12 percent. Besides its positive impact on digestive health, yogurt is a light and healthy snack, especially when combined with super foods such as strawberries and blueberries. It also offers plenty of protein and calcium.     Salt Water   According the American Journal of Preventative Medicine, gargling with salt water can help prevent upper respiratory   infections, as well as decrease the severity of their symptoms. Using a saline solution may also ease sore throat pain and nasal congestion. Dissolve a teaspoon of salt in a full glass of water and then swish it around your mouth and throat before spitting it out. Gargling with salt water reduces and loosens mucus, which can then expel bacteria and allergens from the body. If you use salt water in a neti pot for nasal congestion, use water that has been boiled first--and subsequently cooled--in order  to prevent the spread of harmful bacteria. I like the brand SinuCleanse.     Humidity   Influenza thrives and transfers easier in dry environments, so decrease your exposure by creating more humidity in your home. Temporarily adding a cool-mist humidifier in the bedroom may make a sick person more comfortable, especially in winter, when dry indoor heat can easily exacerbate symptoms. Increased humidity can reduce nasal inflammation, making it easier to breathe. Adding a few drops of eucalyptus oil might also stimulate breathing. (Remember, the water used in humidifiers needs to be changed daily to avoid fungi and molds. For the same effect without a humidifier, take a long shower or linger in a steamy bathroom.)     Vapor Rub   Old-fashioned topical ointments like vapor rub appear to reduce cold symptoms in children older than two years, according to a study published in Pediatrics. Just one or two applications before bed can open air passages to combat congestion, reduce coughing, and help children sleep.     Warm Baths   You can reduce a child's fever by giving him or her a warm sponge bath, but warm baths are also useful to reduce cold and flu symptoms in adults. Adding one box of Epsom salt and one box of baking soda to the water can also help reduce body aches, especially with the addition of a few drops of essential oil. Recommended oils include tea tree, juniper, rosemary, thyme, [place], lavender, and eucalyptus.

## 2018-07-31 ENCOUNTER — Ambulatory Visit
Admission: RE | Admit: 2018-07-31 | Discharge: 2018-07-31 | Disposition: A | Discharge status: Home / Self Care | Source: Ambulatory Visit | Attending: Family Medicine | Admitting: Family Medicine

## 2018-07-31 DIAGNOSIS — Z1231 Encounter for screening mammogram for malignant neoplasm of breast: Principal | ICD-10-CM

## 2018-08-09 ENCOUNTER — Telehealth (HOSPITAL_BASED_OUTPATIENT_CLINIC_OR_DEPARTMENT_OTHER): Payer: Self-pay | Admitting: Family

## 2018-08-09 DIAGNOSIS — R922 Inconclusive mammogram: Principal | ICD-10-CM

## 2018-08-09 NOTE — Telephone Encounter (Signed)
Pt had neg mammogram, but density at category C, so we will f/u with [place].  I spoke with pt and she will wait for radiology to call her.

## 2018-09-25 ENCOUNTER — Encounter (HOSPITAL_BASED_OUTPATIENT_CLINIC_OR_DEPARTMENT_OTHER): Payer: Self-pay | Admitting: Family Medicine

## 2018-11-18 ENCOUNTER — Telehealth (HOSPITAL_BASED_OUTPATIENT_CLINIC_OR_DEPARTMENT_OTHER): Payer: Self-pay | Admitting: Family Medicine

## 2018-11-18 DIAGNOSIS — Z20822 Contact with and (suspected) exposure to covid-19: Secondary | ICD-10-CM

## 2018-11-18 DIAGNOSIS — Z20828 Contact with and (suspected) exposure to other viral communicable diseases: Secondary | ICD-10-CM

## 2018-11-18 NOTE — Telephone Encounter (Signed)
Wait until end of week as long as asymptomatic.

## 2018-11-18 NOTE — Telephone Encounter (Signed)
Pt would like a COVID test ordered for them. Their granddaughter's boyfriend has just tested positive for it, and they spent time with her, but not the boyfriend, on Father's day. They currently have no symptoms.

## 2018-11-18 NOTE — Telephone Encounter (Signed)
Pt informed will wait till end of week unless they get symptoms

## 2018-11-21 ENCOUNTER — Ambulatory Visit: Attending: Family Medicine

## 2018-11-21 DIAGNOSIS — M810 Age-related osteoporosis without current pathological fracture: Secondary | ICD-10-CM

## 2018-11-21 DIAGNOSIS — Z20828 Contact with and (suspected) exposure to other viral communicable diseases: Secondary | ICD-10-CM

## 2018-11-21 DIAGNOSIS — E559 Vitamin D deficiency, unspecified: Secondary | ICD-10-CM

## 2018-11-21 DIAGNOSIS — Z20822 Contact with and (suspected) exposure to covid-19: Secondary | ICD-10-CM

## 2018-11-21 DIAGNOSIS — E785 Hyperlipidemia, unspecified: Secondary | ICD-10-CM

## 2018-11-21 LAB — COMPREHENSIVE METABOLIC PANEL
Adjusted Calcium: 9.5 mg/dL (ref 8.7–10.2)
Alanine Transferase (ALT): 33 U/L (ref 4–56)
Alb/Glob Ratio: 1.5 (ref 1.0–1.6)
Albumin: 4.4 g/dL (ref 3.2–4.7)
Alkaline Phosphatase (ALP): 63 U/L (ref 38–126)
Aspartate Transaminase (AST): 25 U/L (ref 9–44)
BUN/ Creatinine: 30 — ABNORMAL HIGH (ref 7.3–21.7)
Bilirubin Total: 0.8 mg/dL (ref 0.1–2.2)
Calcium: 9.8 mg/dL (ref 8.7–10.2)
Carbon Dioxide Total: 24 mmol/L (ref 22–32)
Chloride: 105 mmol/L (ref 99–109)
Creatinine Serum: 0.6 mg/dL (ref 0.50–1.30)
E-GFR, Non-African American: 109 mL/min/{1.73_m2} (ref >60)
E-GFR: 126 mL/min/{1.73_m2} (ref >60)
Globulin: 2.9 g/dL (ref 2.2–4.2)
Glucose: 101 mg/dL — ABNORMAL HIGH (ref 70–99)
Potassium: 4.1 mmol/L (ref 3.5–5.2)
Protein: 7.3 g/dL (ref 5.9–8.2)
Sodium: 139 mmol/L (ref 134–143)
Urea Nitrogen, Blood (BUN): 18 mg/dL (ref 6–21)

## 2018-11-21 LAB — LIPID PANEL
Cholesterol: 245 mg/dL — ABNORMAL HIGH (ref 112–200)
HDL Cholesterol: 86 mg/dL (ref >=40)
LDL Cholesterol Calculation: 132 mg/dL — ABNORMAL HIGH (ref <100)
Non-HDL Cholesterol: 159 mg/dL — ABNORMAL HIGH (ref <=150.0)
Total Cholesterol: HDL Ratio: 2.8 mg/dL (ref 2.0–5.0)
Triglyceride: 134 mg/dL (ref 30–150)

## 2018-11-22 LAB — VITAMIN D, 25 HYDROXY: Vitamin D, 25 Hydroxy: 35.2 ng/mL (ref 30–80)

## 2018-11-22 LAB — MMC SARS-COV-2 IGG: SARS-CoV-2 IgG MMC: NEGATIVE

## 2018-11-26 ENCOUNTER — Encounter (HOSPITAL_BASED_OUTPATIENT_CLINIC_OR_DEPARTMENT_OTHER): Payer: Self-pay | Admitting: Family Medicine

## 2018-11-26 NOTE — Telephone Encounter (Signed)
From: Jani Files  To: Hedy Camara, MD  Sent: 11/25/2018 2:29 PM PDT  Subject: Test Result Question    On 11/21/18 when I went in for a Covid test, the lab also drew blood for a Lipid panel. I wasn't anticipating this and didn't fast before hand. My question is do I need to redo the blood test before my Aug 7th appt. and fast before?  Thank you,   Rachel [name]

## 2018-11-28 LAB — LAB MISCELLANEOUS PROCEDURE

## 2018-12-27 ENCOUNTER — Ambulatory Visit (HOSPITAL_BASED_OUTPATIENT_CLINIC_OR_DEPARTMENT_OTHER): Admitting: Family Medicine

## 2018-12-27 ENCOUNTER — Encounter (HOSPITAL_BASED_OUTPATIENT_CLINIC_OR_DEPARTMENT_OTHER): Payer: Self-pay | Admitting: Family Medicine

## 2018-12-27 VITALS — BP 106/70 | HR 62 | Temp 98.3°F | Resp 16 | Ht 64.0 in | Wt 149.2 lb

## 2018-12-27 DIAGNOSIS — E559 Vitamin D deficiency, unspecified: Secondary | ICD-10-CM

## 2018-12-27 DIAGNOSIS — K580 Irritable bowel syndrome with diarrhea: Secondary | ICD-10-CM

## 2018-12-27 DIAGNOSIS — E785 Hyperlipidemia, unspecified: Secondary | ICD-10-CM

## 2018-12-27 DIAGNOSIS — Z7189 Other specified counseling: Secondary | ICD-10-CM

## 2018-12-27 DIAGNOSIS — J301 Allergic rhinitis due to pollen: Secondary | ICD-10-CM

## 2018-12-27 DIAGNOSIS — Z23 Encounter for immunization: Secondary | ICD-10-CM

## 2018-12-27 DIAGNOSIS — M810 Age-related osteoporosis without current pathological fracture: Secondary | ICD-10-CM

## 2018-12-27 DIAGNOSIS — M159 Polyosteoarthritis, unspecified: Secondary | ICD-10-CM

## 2018-12-27 DIAGNOSIS — Z7185 Encounter for immunization safety counseling: Secondary | ICD-10-CM

## 2018-12-27 DIAGNOSIS — Z0000 Encounter for general adult medical examination without abnormal findings: Secondary | ICD-10-CM

## 2018-12-27 DIAGNOSIS — H6981 Other specified disorders of Eustachian tube, right ear: Secondary | ICD-10-CM

## 2018-12-27 MED ORDER — SHINGRIX (PF) 50 MCG/0.5 ML INTRAMUSCULAR SUSPENSION, KIT
0.5000 mL | INHALATION_SUSPENSION | INTRAMUSCULAR | 1 refills | Status: AC
Start: 2018-12-27 — End: 2019-12-22

## 2018-12-27 MED ORDER — TWINRIX (PF) 720 ELISA UNIT-20 MCG/ML INTRAMUSCULAR SUSPENSION
0.5000 mL | INTRAMUSCULAR | 2 refills | Status: AC
Start: 2018-12-27 — End: 2019-12-22

## 2018-12-27 NOTE — Progress Notes (Signed)
HPI:  Rachel [name] is a 68 yr-old female who is here today for a Welcome to [facility] Exam.    We reviewed PMH/PSH, family and social histories.  I obtained further information when needed and entered it into the EMR chart.    She has no immediate health concerns.      Patient obtained labs prior to this appointment. We reviewed and discussed the results in detail. The results revealed no concerns or signficant abnormalities.  They indicate that various health conditions are stable.       She will be due for a DEXA Scan in 04/2019.  This was ordered.  We again discussed treatment.  She is not interested at this time, but will consider it pending the results.    We spent some time discussing staying safe during the current public health crisis, including "Sheltering-In-Place, social distancing and ways to make the home more safe (especially after making trips "into town".      Throughout the discussion, she was given the opportunity to ask questions.  These were answered to her stated satisfaction.  By the end of the conversation, she verbalized understanding and will consider these options.    Patient is otherwise doing well.  Denies any concerns over other chronic health conditions.  Denies any recent acute illnesses.    ROS:  A Review of Systems performed. Pertinent details discussed in HPI. Otherwise, denies any issues or other physical concerns    Vitals:  BP 106/70 (SITE: left arm, Orthostatic Position: sitting, Cuff Size: regular)   Pulse 62   Temp 36.8 C (98.3 F) (Temporal)   Resp 16   Ht 1.626 m (5\' 4" )   Wt 67.7 kg (149 lb 3.2 oz)   SpO2 96%   BMI 25.61 kg/m     Physical Exam  Constitutional:       Appearance: She is well-developed.   HENT:      Head: Normocephalic and atraumatic.   Eyes:      Conjunctiva/sclera: Conjunctivae normal.      Pupils: Pupils are equal, round, and reactive to light.   Neck:      Musculoskeletal: Normal range of motion.   Cardiovascular:      Rate and Rhythm:  Normal rate and regular rhythm.      Heart sounds: Normal heart sounds. No murmur. No friction rub. No gallop.    Pulmonary:      Effort: Pulmonary effort is normal. No respiratory distress.      Breath sounds: Normal breath sounds. No wheezing or rales.   Abdominal:      General: Bowel sounds are normal. There is no distension.      Palpations: Abdomen is soft. There is no mass.      Tenderness: There is no abdominal tenderness.   Musculoskeletal: Normal range of motion.   Skin:     General: Skin is warm and dry.   Neurological:      Mental Status: She is alert and oriented to person, place, and time.   Psychiatric:         Behavior: Behavior normal.         Thought Content: Thought content normal.         Judgment: Judgment normal.     Preventative Healthcare and Screens    Advanced Care Planning  We spent approximately 1-2 minutes and discussed End of Life Care as well as the decisions and planning associated with such care.  I assured the patient  that such planning is voluntary and not a requirement to receive medical care.    She still has the documentation associated with such decisions as provided previously.  She has not completed them yet.    [facility]  [facility] Care provided by [facility].  - LMP:  ~2011  - Last Pap/pelvic:  04/08/2015.  Recommended returning to Gyn       - h/o abnormal exam:  No  - Last Mammogram:  07/31/2018       - h/o abnormal mammograms:  No    Cardiovascular Health  An EKG was during the Welcome to [employer] Exam.  It revealed NSR.  Axises, wave intervals and wave morphology were all within expect parameters.    Pulmonary Health  Due to patient's smoking history, a Lung Cancer Screen (Low-dose Chest CT) is not appropriate.      Bone Health  - Last DEXA Scan:  04/30/2017  - LaResults:  Osteoporosis with a T-Score to -2.5    GI Health  - Last Colon Cancer Screening:  05/03/2012  - Last colonoscopy:  05/03/2012       - Monitoring schedule:  04/2022   - Hepatitis C  Screen:  Refused    Vaccinations  - Flu vaccination:  Refused.  Recommended consideration, especially this year.  - Pneumococcal vaccination:     - PPSV23:  Ordered.   - PCV13:  09/19/2017  - Td/TdaP vaccination:  TdaP - 08/11/2015  - History of chicken pox:  Unsure  - History of Shingles Outbreak:  No  - Shingles Vaccination:  Zostavax 08/11/2015.  Rx provided for Shingrix.  - Hepatitis A/B Vaccination:  Discussed.  Rx provided.    Ocular Health  - Routine visits with eye specialist:  Annual exams with glaucoma screens  We discussed the importance of routine eye care to monitor for conditions such as glaucoma and cataracts.    Dental Health   - Routine dental visits:  Semiannual cleanings.  Annual check ups with X-Rays   We discussed the importance for continued dental care.    Dermatologic Health  - Routine visits to a Dermatologist:  Established with Dr. No.  Routine skin checks performed.  We discussed the importance of regular skin checks - both personal (with aid of family members) and by a medical professional.  We also discussed the need for skin protection from the sun.    Assessment and Plan:    ICD-10-CM    1. Encounter for Medicare annual wellness exam Z00.00    2. Counseling regarding end of life decision making Z71.89    3. Immunization counseling Z71.89    4. Dyslipidemia E78.5 COMPREHENSIVE METABOLIC PANEL     LIPID PANEL   5. Senile osteoporosis M81.0 [facility] DEXA     COMPREHENSIVE METABOLIC PANEL     VITAMIN D, 25 HYDROXY   6. 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 Chronic seasonal allergic rhinitis due to pollen J30.1    8. Chronic dysfunction of right eustachian tube H69.81    9. Irritable bowel syndrome with diarrhea K58.0    10. Need for shingles vaccine Z23 Zoster Vaccine Recombinant (SHINGRIX, PF,) 50 mcg/0.5 mL Suspension for Reconstitution Injection   11. Need for hepatitis A and B vaccination Z23 Hepatitis A & B Vaccine, PF, (TWINRIX PF) 720 ELISA unit- 20  mcg/mL Suspension   12. Need for 23-polyvalent pneumococcal polysaccharide vaccine Z23 PNEUMOCOCCAL VACCINE,23-VALENT,ADULT   13. Vitamin D deficiency  E55.9 VITAMIN D, 25 HYDROXY     RTC 1 Year for [employer] Annual Exam.      If patient would like to consider starting Prolia, she has been invited to contact clinic.  We will then start the Prior Authorization process and order labs.    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 Eddie Candleummings.  Next one due in 04/2022       Family History   Problem Relation Name Age of Onset    Hypertension Mother      Chronic Renal Disease Mother      Other (Prostate Cancer with bone mets) Father      No Known Problems Sister      No Known Problems Brother      No Known Problems Maternal Grandmother      No Known Problems Maternal Grandfather      No Known Problems Paternal Grandmother      No Known Problems Paternal Grandfather      Breast Cancer Other female cousin     No Known Problems Sister      No Known Problems Daughter      No Known Problems Daughter      No Known Problems Son         Social History     Socioeconomic History    Marital status: MARRIED     Spouse name: Scotty CourtMartin, Gary    Number of children: 3    Years of education: Not on file    Highest education level: Not on file   Occupational History    Occupation: Retired   Oncologistocial Needs    Financial resource strain: Not hard at [employer]    Food insecurity     Worry: Never true     Inability: Never true    Transportation needs      Medical: No     Non-medical: No   Tobacco Use    Smoking status: Never Smoker    Smokeless tobacco: Never Used   Substance and Sexual Activity    Alcohol use: Yes     Alcohol/week: 7.0 - 14.0 standard drinks     Types: 7 - 14 Glasses of wine per week    Drug use: No    Sexual activity: Not on file   Lifestyle    Physical activity     Days per week: 4 days     Minutes per session: 60 min    Stress: Only a little   Relationships    Social connections     Talks on phone: More than three times a week     Gets together: More than three times a week     Attends religious service: More than 4 times per year     Active member of club or organization: Yes     Attends meetings of clubs or organizations: More than 4 times  per year     Relationship status: Married    Intimate partner violence     Fear of current or ex partner: No     Emotionally abused: No     Physically abused: No     Forced sexual activity: No   Other Topics Concern    Military Service No    Blood Transfusions No    Caffeine Concern No    Occupational Exposure No    Hobby Hazards No    Sleep Concern No    Stress Concern No    Weight Concern Yes    Special Diet Yes    Back Care No    Exercise Yes    Bike Helmet Yes    Seat Belt Yes    Self-Exams Yes   Social History Narrative    BLANK

## 2018-12-27 NOTE — Nursing Note (Signed)
Script to say to the patient "Due to the corona virus, screening guidelines are in effect to protect our community. I would like to ask you a few questions. Regardless of your answers we will be helping you with your medical needs."      Patient Reported Covid Symptoms: Patient Reported Covid Symptoms: None 98.3  The following is reflective of current CDC guidelines, other symptoms may be documented in this DotPhrase.      Actions:  1) Fever/Fever Hx / New or Worsening Cough/ Known Exposure within 14 days or 2 or more symptoms: Not allowed in clinic    -Send to local PCP triage and notify the provider who was supposed to see the patient.    2) If no symptoms or only one symptome (not listed in bullet one above): Ok to enter, wearing a mask.      Revised 09/26/18

## 2018-12-27 NOTE — Patient Instructions (Addendum)
Preventative Healthcare Maintenance Review and Recommendations    Advanced Care Planning  End of Life Care decisions as well as the documents associated with such decisions are voluntary and not a requirement to receive medical care.  However, they are meant to protect you and the decision.  They are also for your family when you have reached that point in your life.    I recommend at least completing the 5 Wishes Document when you can.    Routine Well-woman Care  - Last Pap/pelvic:  04/08/2015.  Recommend consideration for another exam.  Discuss with Rachel [name].  - Last Mammogram:  07/31/2018.  Next one due in 07/2019    Cardiovascular Health  An EKG was during the Welcome to [facility] Exam.  It revealed NSR.  Axises, wave intervals and wave morphology were all within expect parameters.    Pulmonary Health  Due to patient's smoking history, a Lung Cancer Screen (Low-dose Chest CT) is not appropriate.      Bone Health  - Last DEXA Scan:  04/30/2017.  Next one due in 04/2019  - LaResults:  Osteoporosis with a T-Score to -2.5    If you would like to consider starting Prolia (for treatment of Osteoporosis), please contact clinic.  We will then start the Prior Authorization process and order labs.    GI Health  - Last Colon Cancer Screening via colonoscopy in 05/03/2012.  Next one due in 04/2022     Vaccinations  - Flu vaccination:  Recommend consideration for flu shot this year, starting in 02/2019.  - Pneumococcal vaccination:     - PPSV23:  Ordered today.   - PCV13:  09/19/2017  - Td/TdaP vaccination:  TdaP - 08/11/2015  - Shingles Vaccination:  Zostavax - 08/11/2015.  Discuss Shingrix with your pharmacy.  - Hepatitis A/B Vaccination:  Discuss Twinrix with your pharmacy.    Ocular Health  I recommend continuing routine visits with [facility] to monitor for conditions such as glaucoma and cataracts.    Dental Health  I recommend continuing routine dental visits for regular cleanings and check ups with  X-Rays.    Dermatologic Health  I recommend regular skin checks - both personal (with aid of family members) and by Dr. No.  I also recommend skin protection - either through application of sun screen or through clothing (hats, over garments) - when spending time out in the sun.    Recommendations for staying safe during the current COVID-19 Pandemic  Now that there is talk of reopening society, there are still some things you can do to keep safe.    I recommend continuing to stay home and venturing out only when absolutely necessary.  If you do feel "trapped" in your home and need to go out for relief, I recommended these options over those that can lead to greater physical contact at this time:   Having a take-out picnic instead of eating in a restaurant   Watching a movie at the local drive in instead of the movie theater   Advocating for video conferencing at work and with loved ones if either of you are considered at high-risk.   Participating in outdoor activities - walking on trails/in parks, fishing, bicycling ("solo"), hiking ("solo"), taking a drive with a picnic packed, etc.    Please review social distancing methods and consider continuing to use them as a means to stay safe while going to the store(s) and doing other errands:   Wearing masks  o  Cloth preferred to allow for re-washing  o N95 not needed   Consider safety glasses as well   Maintain at least 12 ft of separation if possible, 6 ft minimum, especially if the other person is not wearing a mask   Practice repeated hand-washing and use of alcohol gel hand sanitizer.    o If you can watch it, Vanetta Shawllton Brown has an excellent YouTube video on hand washing   Avoid "The Three C's";  1. Closed spaces;  2. Crowded places, and;   3. Close-contact settings (no persons of unknown contact history closer than 12-feet).     I also recommend keeping your home as clean and sanitized as possible.  This can include creating a contaminated/decontamination  space away from the other living areas in the home (e.g. garage space, mud room, etc), supplied with a laundry hamper, spare clothes and towels.  That space can then be used to change out of possibly contaminated clothes and a place where you can prepare to shower (possibly in a guest or spare shower) before entering the rest of the home and living spaces.  As a part of this, you should also consider washing contaminated clothes (from that hamper) and cloth masks regularly, if not daily.    [facility]  https://ed[place]gov.[place]/Government/hhsa/covid-19-cases

## 2019-04-01 ENCOUNTER — Telehealth (HOSPITAL_BASED_OUTPATIENT_CLINIC_OR_DEPARTMENT_OTHER): Payer: Self-pay | Admitting: Family Medicine

## 2019-04-01 DIAGNOSIS — Z20822 Contact with and (suspected) exposure to covid-19: Secondary | ICD-10-CM

## 2019-04-01 DIAGNOSIS — Z20828 Contact with and (suspected) exposure to other viral communicable diseases: Secondary | ICD-10-CM

## 2019-04-01 NOTE — Telephone Encounter (Signed)
Pt is asking for the covid swab. She reports sore throat and feels the need to clear her throat. Has not been exposed to the virus that she knows of or to an area with a high volume of cases. Please advise

## 2019-04-02 NOTE — Telephone Encounter (Signed)
Spoke with pt and told her what she needs to do

## 2019-04-02 NOTE — Telephone Encounter (Signed)
LVM on home & cell# asking pt to c/b to go over COVID testing protocol.

## 2019-04-02 NOTE — Telephone Encounter (Signed)
COVID Swab ordered.

## 2019-04-03 ENCOUNTER — Ambulatory Visit: Attending: Family Medicine

## 2019-04-03 DIAGNOSIS — Z20828 Contact with and (suspected) exposure to other viral communicable diseases: Secondary | ICD-10-CM

## 2019-04-07 ENCOUNTER — Telehealth (HOSPITAL_BASED_OUTPATIENT_CLINIC_OR_DEPARTMENT_OTHER): Payer: Self-pay | Admitting: Family Medicine

## 2019-04-07 NOTE — Telephone Encounter (Signed)
Patient saw on my chart she is covid positive and is concerned, patient requesting results and recommendations

## 2019-04-08 NOTE — Telephone Encounter (Signed)
Discussed with patient yesterday.  I will continue to follow up with her.

## 2019-04-08 NOTE — Telephone Encounter (Signed)
Pt spoke with pt.

## 2019-04-09 LAB — LABCORP COVID-19: SARS-CoV-2, NAA: DETECTED — AB

## 2019-05-12 ENCOUNTER — Ambulatory Visit
Admission: RE | Admit: 2019-05-12 | Discharge: 2019-05-12 | Disposition: A | Discharge status: Home / Self Care | Source: Ambulatory Visit | Attending: Family Medicine | Admitting: Family Medicine

## 2019-05-12 DIAGNOSIS — M810 Age-related osteoporosis without current pathological fracture: Secondary | ICD-10-CM

## 2019-07-19 ENCOUNTER — Telehealth (HOSPITAL_BASED_OUTPATIENT_CLINIC_OR_DEPARTMENT_OTHER): Payer: Self-pay | Admitting: NURSE PRACTITIONER

## 2019-07-19 DIAGNOSIS — R509 Fever, unspecified: Secondary | ICD-10-CM

## 2019-07-19 DIAGNOSIS — R5081 Fever presenting with conditions classified elsewhere: Secondary | ICD-10-CM

## 2019-07-19 NOTE — Telephone Encounter (Signed)
Pt calling and states that she is currently experiencing 100* fever and lower abs cramping.  Pt tested positive in November for COVID and seeking advise as to if she should get tested?

## 2019-07-21 ENCOUNTER — Ambulatory Visit: Attending: NURSE PRACTITIONER

## 2019-07-21 DIAGNOSIS — R5081 Fever presenting with conditions classified elsewhere: Secondary | ICD-10-CM

## 2019-07-21 LAB — MMC NAAT FOR SARS-COV-2: Coronavirus by PCR MMC: NEGATIVE

## 2019-07-23 ENCOUNTER — Ambulatory Visit: Attending: NURSE PRACTITIONER

## 2019-07-23 DIAGNOSIS — R5081 Fever presenting with conditions classified elsewhere: Secondary | ICD-10-CM | POA: Insufficient documentation

## 2019-07-23 DIAGNOSIS — R509 Fever, unspecified: Secondary | ICD-10-CM

## 2019-07-23 LAB — COMPREHENSIVE METABOLIC PANEL
Adjusted Calcium: 9.2 mg/dL (ref 8.7–10.2)
Alanine Transferase (ALT): 29 U/L (ref 4–56)
Alb/Glob Ratio: 1.3 (ref 1.0–1.6)
Albumin: 4.2 g/dL (ref 3.2–4.7)
Alkaline Phosphatase (ALP): 74 U/L (ref 38–126)
Aspartate Transaminase (AST): 27 U/L (ref 9–44)
BUN/ Creatinine: 27.5 — ABNORMAL HIGH (ref 7.3–21.7)
Bilirubin Total: 0.5 mg/dL (ref 0.1–2.2)
Calcium: 9.4 mg/dL (ref 8.7–10.2)
Carbon Dioxide Total: 25 mmol/L (ref 22–32)
Chloride: 101 mmol/L (ref 99–109)
Creatinine Serum: 0.51 mg/dL (ref 0.50–1.30)
E-GFR, Non-African American: 132 mL/min/{1.73_m2} (ref >60)
E-GFR: 152 mL/min/{1.73_m2} (ref >60)
Globulin: 3.2 g/dL (ref 2.2–4.2)
Glucose: 86 mg/dL (ref 70–99)
Potassium: 4.1 mmol/L (ref 3.5–5.2)
Protein: 7.4 g/dL (ref 5.9–8.2)
Sodium: 137 mmol/L (ref 134–143)
Urea Nitrogen, Blood (BUN): 14 mg/dL (ref 6–21)

## 2019-07-23 LAB — URINALYSIS AND CULTURE IF IND
Bilirubin: NEGATIVE
Glucose: NEGATIVE mg/dL
Ketones: NEGATIVE
Leukocyte Esterase: NEGATIVE
Nitrite: NEGATIVE
Occult Blood: NEGATIVE
Protein: NEGATIVE mg/dL
Specific Gravity: 1.008 (ref 1.002–1.030)
Urobilinogen: NEGATIVE mg/dL
pH Urine: 8 (ref 5.0–9.0)

## 2019-07-23 LAB — CBC NO DIFFERENTIAL
Hematocrit: 41.8 % (ref 36.0–48.0)
Hemoglobin: 14 g/dL (ref 12.0–16.0)
MCH: 30.3 pg (ref 27.0–34.0)
MCHC g/dL: 33.5 g/dL (ref 33.0–37.0)
MCV: 90.5 fL (ref 82.0–97.0)
MPV: 9.2 fL — ABNORMAL LOW (ref 9.4–12.4)
Nucleated Cell Count: 0 10*3/uL (ref 0.0–0.1)
Nucleated RBC/100 WBC: 0 % WBC (ref <=0.0)
Platelet Count: 273 10*3/uL (ref 151–365)
RDW: 11.7 % (ref 11.5–14.5)
Red Blood Cell Count: 4.62 10*6/uL (ref 3.80–5.10)
White Blood Cell Count: 6.3 10*3/uL (ref 4.2–10.8)

## 2019-11-25 ENCOUNTER — Other Ambulatory Visit (HOSPITAL_BASED_OUTPATIENT_CLINIC_OR_DEPARTMENT_OTHER): Payer: Self-pay | Admitting: Family Medicine

## 2019-11-25 DIAGNOSIS — Z1211 Encounter for screening for malignant neoplasm of colon: Secondary | ICD-10-CM

## 2019-12-11 ENCOUNTER — Ambulatory Visit: Attending: Family Medicine

## 2019-12-11 DIAGNOSIS — M810 Age-related osteoporosis without current pathological fracture: Secondary | ICD-10-CM

## 2019-12-11 DIAGNOSIS — E559 Vitamin D deficiency, unspecified: Secondary | ICD-10-CM

## 2019-12-11 DIAGNOSIS — E785 Hyperlipidemia, unspecified: Secondary | ICD-10-CM | POA: Insufficient documentation

## 2019-12-11 LAB — COMPREHENSIVE METABOLIC PANEL
Adjusted Calcium: 9 mg/dL (ref 8.7–10.2)
Alanine Transferase (ALT): 19 U/L (ref 4–56)
Alb/Glob Ratio: 1.5 (ref 1.0–1.6)
Albumin: 4.2 g/dL (ref 3.2–4.7)
Alkaline Phosphatase (ALP): 45 U/L (ref 38–126)
Aspartate Transaminase (AST): 19 U/L (ref 9–44)
BUN/ Creatinine: 25.4 — ABNORMAL HIGH (ref 7.3–21.7)
Bilirubin Total: 1.1 mg/dL (ref 0.1–2.2)
Calcium: 9.2 mg/dL (ref 8.7–10.2)
Carbon Dioxide Total: 27 mmol/L (ref 22–32)
Chloride: 104 mmol/L (ref 99–109)
Creatinine Serum: 0.59 mg/dL (ref 0.50–1.30)
E-GFR, Non-African American: 111 mL/min/{1.73_m2} (ref >60)
E-GFR: 127 mL/min/{1.73_m2} (ref >60)
Globulin: 2.8 g/dL (ref 2.2–4.2)
Glucose: 100 mg/dL — ABNORMAL HIGH (ref 70–99)
Potassium: 3.7 mmol/L (ref 3.5–5.2)
Protein: 7 g/dL (ref 5.9–8.2)
Sodium: 139 mmol/L (ref 134–143)
Urea Nitrogen, Blood (BUN): 15 mg/dL (ref 6–21)

## 2019-12-11 LAB — LIPID PANEL
Cholesterol: 259 mg/dL — ABNORMAL HIGH (ref 112–200)
HDL Cholesterol: 87 mg/dL (ref >=40)
LDL Cholesterol Calculation: 155 mg/dL — ABNORMAL HIGH (ref <100)
Non-HDL Cholesterol: 172 mg/dL — ABNORMAL HIGH (ref <=150.0)
Total Cholesterol: HDL Ratio: 3 mg/dL (ref 2.0–5.0)
Triglyceride: 86 mg/dL (ref 30–150)

## 2019-12-11 LAB — VITAMIN D, 25 HYDROXY: Vitamin D, 25 Hydroxy: 32.5 ng/mL (ref 30–80)

## 2019-12-29 ENCOUNTER — Encounter (HOSPITAL_BASED_OUTPATIENT_CLINIC_OR_DEPARTMENT_OTHER): Payer: Self-pay | Admitting: Family Medicine

## 2019-12-30 ENCOUNTER — Ambulatory Visit (HOSPITAL_BASED_OUTPATIENT_CLINIC_OR_DEPARTMENT_OTHER): Admitting: Family Medicine

## 2019-12-30 ENCOUNTER — Encounter (HOSPITAL_BASED_OUTPATIENT_CLINIC_OR_DEPARTMENT_OTHER): Payer: Self-pay | Admitting: Family Medicine

## 2019-12-30 VITALS — BP 140/80 | HR 68 | Temp 98.9°F | Resp 16 | Ht 64.0 in | Wt 145.2 lb

## 2019-12-30 DIAGNOSIS — M810 Age-related osteoporosis without current pathological fracture: Secondary | ICD-10-CM

## 2019-12-30 DIAGNOSIS — Z7189 Other specified counseling: Secondary | ICD-10-CM

## 2019-12-30 DIAGNOSIS — Z0000 Encounter for general adult medical examination without abnormal findings: Secondary | ICD-10-CM

## 2019-12-30 DIAGNOSIS — E785 Hyperlipidemia, unspecified: Secondary | ICD-10-CM

## 2019-12-30 DIAGNOSIS — Z23 Encounter for immunization: Secondary | ICD-10-CM

## 2019-12-30 DIAGNOSIS — K580 Irritable bowel syndrome with diarrhea: Secondary | ICD-10-CM

## 2019-12-30 DIAGNOSIS — R922 Inconclusive mammogram: Secondary | ICD-10-CM | POA: Insufficient documentation

## 2019-12-30 DIAGNOSIS — K5730 Diverticulosis of large intestine without perforation or abscess without bleeding: Secondary | ICD-10-CM

## 2019-12-30 DIAGNOSIS — M159 Polyosteoarthritis, unspecified: Secondary | ICD-10-CM

## 2019-12-30 DIAGNOSIS — H6981 Other specified disorders of Eustachian tube, right ear: Secondary | ICD-10-CM

## 2019-12-30 DIAGNOSIS — Z7185 Encounter for immunization safety counseling: Secondary | ICD-10-CM

## 2019-12-30 DIAGNOSIS — Z1231 Encounter for screening mammogram for malignant neoplasm of breast: Secondary | ICD-10-CM

## 2019-12-30 DIAGNOSIS — E559 Vitamin D deficiency, unspecified: Secondary | ICD-10-CM | POA: Insufficient documentation

## 2019-12-30 DIAGNOSIS — J301 Allergic rhinitis due to pollen: Secondary | ICD-10-CM

## 2019-12-30 MED ORDER — SHINGRIX (PF) 50 MCG/0.5 ML INTRAMUSCULAR SUSPENSION, KIT
0.5000 mL | INHALATION_SUSPENSION | INTRAMUSCULAR | 1 refills | Status: AC
Start: 2019-12-30 — End: 2020-12-24

## 2019-12-30 MED ORDER — TWINRIX (PF) 720 ELISA UNIT-20 MCG/ML INTRAMUSCULAR SYRINGE
1.0000 | INJECTION | Freq: Once | INTRAMUSCULAR | 2 refills | Status: AC
Start: 2019-12-30 — End: 2020-12-24

## 2019-12-30 NOTE — Progress Notes (Signed)
HPI:  Rachel [name] is a 69 yr-old female who is here today for a Welcome to [facility] Exam.    We reviewed PMH/PSH, family and social histories.  I obtained further information when needed and entered it into the EMR chart.    She has no immediate health concerns.      Patient obtained labs prior to this appointment.  We reviewed and discussed the results in detail.     Lipid Panel while revealing a TCh:HDL is within normal limits, shows an LDL limit above the upper limit.  We discussed concerns.  She is not interested in starting a statin yet.  However, she is willing to retest and if it remains the same, she would consider a low-dose.    The results revealed no concerns or signficant abnormalities.  They indicate that various health conditions are stable.       We again discussed Osteoporosis.  She is still thinking about treatment.  Unfortunately, she does have dental and mandibular health problems.      We discussed the concerns as well as management options moving forward.  I recommended consideration for Fosamax.    Throughout the discussion, she was given an opportunity to ask questions.  These were answered to her stated satisfaction.  She verbalized understanding.  She would like to work with the dentist first.    Patient is otherwise doing well.  Denies any concerns over other chronic health conditions.  Denies any recent acute illnesses.    ROS:  A Review of Systems performed. Pertinent details discussed in HPI. Otherwise, denies any issues or other physical concerns    Vitals:  BP 140/80 (SITE: left arm, Orthostatic Position: sitting, Cuff Size: regular)   Pulse 68   Temp 37.2 C (98.9 F) (Temporal)   Resp 16   Ht 1.626 m (5\' 4" )   Wt 65.9 kg (145 lb 3.2 oz)   SpO2 96%   BMI 24.92 kg/m     Physical Exam  Constitutional:       Appearance: She is well-developed.   HENT:      Head: Normocephalic and atraumatic.   Eyes:      Conjunctiva/sclera: Conjunctivae normal.      Pupils: Pupils are  equal, round, and reactive to light.   Cardiovascular:      Rate and Rhythm: Normal rate and regular rhythm.      Heart sounds: Normal heart sounds. No murmur heard.   No friction rub. No gallop.    Pulmonary:      Effort: Pulmonary effort is normal. No respiratory distress.      Breath sounds: Normal breath sounds. No wheezing or rales.   Abdominal:      General: Bowel sounds are normal. There is no distension.      Palpations: Abdomen is soft. There is no mass.      Tenderness: There is no abdominal tenderness.   Musculoskeletal:         General: Normal range of motion.      Cervical back: Normal range of motion.   Skin:     General: Skin is warm and dry.   Neurological:      Mental Status: She is alert and oriented to person, place, and time.   Psychiatric:         Behavior: Behavior normal.         Thought Content: Thought content normal.         Judgment: Judgment normal.  Preventative Healthcare     Advanced Care Planning  We spent approximately 1-2 minutes and discussed End of Life Care as well as the decisions and planning associated with such care.  I assured the patient that such planning is voluntary and not a requirement to receive medical care.    She still has the documentation associated with such decisions as provided previously.  She has not completed them yet.    Vaccinations  - Flu vaccination:  Sporadically.  Last one on record - 02/20/2019  - SARS-Cov2 vaccaintion:  Moderna - 07/25/2019; 08/22/2019  - Pneumococcal vaccination:     - PPSV23:  12/27/2018   - PCV13:  09/19/2017  - Td/TdaP vaccination:  TdaP - 08/11/2015  - History of chicken pox:  Unsure  - History of Shingles Outbreak:  No  - Shingles Vaccination:  Zostavax - 08/11/2015.  Shingrix Rx sent to pharmacy.  - Hepatitis A/B Vaccination:  Twinrix Rx sent to pharmacy.    [facility]  [facility].  - LMP:  ~2011  - Last Pap/pelvic:  04/08/2015.  Recommended returning to Gyn       - h/o  abnormal exam:  No  - Last Mammogram:  07/31/2018.  Mammogram ordered.       - h/o abnormal mammograms:  No    Cardiovascular Health  An EKG was during the Welcome to [facility] Exam.  It revealed NSR.  Axises, wave intervals and wave morphology were all within expect parameters.    Pulmonary Health  Due to patient's smoking history, a Lung Cancer Screen (Low-dose Chest CT) is not appropriate.      Bone Health  - Last DEXA Scan:  05/12/2019  DEXA Scan due in 04/2021  - LaResults:  Osteoporosis with a T-Score to -2.5    GI Health  - Last Colon Cancer Screening via colonoscopy in 05/03/2012       - Monitoring schedule:  Next colon cancer screen due in 04/2022   - Hepatitis C Screen:  Refused    Ocular Health  - Routine visits with eye specialist:  Annual exams with glaucoma screens  We discussed the importance of routine eye care to monitor for conditions such as glaucoma and cataracts.    Dental Health   - Routine dental visits:  Semiannual cleanings.  Annual check ups with X-Rays   We discussed the importance for continued dental care.    Dermatologic Health  - Routine visits to a Dermatologist:  Established with Dr. No.  Routine skin checks performed.  We discussed the importance of regular skin checks - both personal (with aid of family members) and by a medical professional.  We also discussed the need for skin protection from the sun.    Assessment and Plan:    ICD-10-CM    1. Encounter for Medicare annual wellness exam  Z00.00    2. Counseling regarding end of life decision making  Z71.89    3. Immunization counseling  Z71.89    4. Dyslipidemia  E78.5 Comprehensive Metabolic Panel     Lipid Panel   5. Senile osteoporosis  M81.0 Comprehensive Metabolic Panel     Vitamin D, 25 Hydroxy   6. Vitamin D deficiency  E55.9 Vitamin D, 25 Hydroxy   7. 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 Chronic seasonal allergic rhinitis due to pollen  J30.1    9. Chronic dysfunction of right  eustachian tube  H69.81    10. Irritable bowel syndrome with diarrhea  K58.0    11. Diverticulosis of large intestine without hemorrhage  K57.30    12. Dense breasts  R92.2 [facility] US BREAST WITH AXILLA BILATERAL   13. Breast cancer screening by mammogram  Z12.31 [facility] MAMMOGRAM BREAST SCREENING W/TOMO     [facility] US BREAST WITH AXILLA BILATERAL   14. Need for shingles vaccine  Z23 Zoster Vaccine Recombinant (SHINGRIX, PF,) 50 mcg/0.5 mL Suspension for Reconstitution Injection   15. Need for hepatitis A and B vaccination  Z23 Hepatitis A & B Vaccine, PF, (TWINRIX PF) 720 ELISA unit- 20 mcg/mL Syringe     RTC 1 Year for [facility] Annual Exam.      If patient would like to consider starting Prolia, she has been invited to contact clinic.  We will then start the Prior Authorization process and order labs.    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 Eddie Candle.  Next one due in 04/2022       Family History   Problem Relation Name Age of Onset    Hypertension Mother      Chronic Renal Disease Mother      Other (Prostate Cancer with bone mets) Father      No Known Problems Sister      No Known Problems Brother      No Known Problems Maternal Grandmother      No Known Problems Maternal Grandfather      No Known Problems Paternal Grandmother      No Known Problems Paternal Grandfather      Breast Cancer Other female cousin     No Known Problems Sister      No Known Problems Daughter      No Known Problems Daughter      No Known Problems Son          Social History     Socioeconomic History    Marital status: MARRIED     Spouse name: Purvi, Ruehl    Number of children: 3    Years of education: Not on file    Highest education level: Not on file   Occupational History    Occupation: Retired   Tobacco Use    Smoking status: Never Smoker    Smokeless tobacco: Never Used   Haematologist Use: Never used   Substance and Sexual Activity    Alcohol use: Yes     Alcohol/week: 7.0 - 14.0 standard drinks     Types: 7 - 14 Glasses of wine per week    Drug use: No    Sexual activity: Yes     Partners: Male   Other Topics Concern    Military Service No    Blood Transfusions No    Caffeine Concern No    Occupational Exposure No    Hobby Hazards  No    Sleep Concern No    Stress Concern No    Weight Concern Yes    Special Diet Yes    Back Care No    Exercise Yes    Bike Helmet Yes    Seat Belt Yes    Self-Exams Yes   Social History Narrative    BLANK     Social Determinants of Health     Financial Resource Strain: Low Risk     Difficulty of Paying Living Expenses: Not hard at all   Food Insecurity: No Food Insecurity    Worried About Programme researcher, broadcasting/film/video in the Last Year: Never true    Ran Out of Food in the Last Year: Never true   Transportation Needs: No Transportation Needs    Lack of Transportation (Medical): No    Lack of Transportation (Non-Medical): No   Physical Activity: Sufficiently Active    Days of Exercise per Week: 4 days    Minutes of Exercise per Session: 60 min   Stress: No Stress Concern Present    Feeling of Stress : Only a little   Social Connections: Music therapist of Communication with Friends and Family: More than three times a week    Frequency of Social Gatherings with Friends and Family: More than three times a week    Attends Religious Services: More than 4 times per year    Active Member of [employer] or Organizations: Yes    Attends Engineer, structural: More than 4 times per  year    Marital Status: Married   Catering manager Violence: Not At Risk    Fear of Current or Ex-Partner: No    Emotionally Abused: No    Physically Abused: No    Sexually Abused: No

## 2019-12-30 NOTE — Patient Instructions (Signed)
Preventative Healthcare Maintenance Review and Recommendations    Advanced Care Planning  End of Life Care decisions as well as the documents associated with such decisions are voluntary and not a requirement to receive medical care.  However, they are meant to protect you and the decision.  They are also for your family when you have reached that point in your life.    I recommend discussing with your lawyer after reviewing your Living Trust.  This is so that all documents pertaining to these decision agree with one another and/or are not redundant.    Vaccinations  - Flu vaccination:  Recommend annual flu shots  - SARS-Cov2 vaccaintion:  Moderna - 07/25/2019; 08/22/2019  - Pneumococcal vaccination:     - PPSV23:  12/27/2018   - PCV13:  09/19/2017  - Td/TdaP vaccination:  TdaP - 08/11/2015  - Shingles Vaccination:  Zostavax - 08/11/2015.  Shingrix Rx sent to pharmacy.  - Hepatitis A/B Vaccination:  Twinrix Rx sent to pharmacy.    Routine Well-woman Care  - Last Mammogram:  07/31/2018.  Mammogram ordered.    Cardiovascular Health  An EKG was during the Welcome to [facility] Exam.  It revealed NSR.  Axises, wave intervals and wave morphology were all within expect parameters.    Pulmonary Health  Due to patient's smoking history, a Lung Cancer Screen (Low-dose Chest CT) is not appropriate.      Bone Health  - Last DEXA Scan:  05/12/2019  DEXA Scan due in 04/2021  - LaResults:  Osteoporosis with a T-Score to -2.5    GI Health  - Last Colon Cancer Screening via colonoscopy in 05/03/2012       - Monitoring schedule:  Next colon cancer screen due in 04/2022   - Hepatitis C Screen:  Consider this screening lab    Ocular Health  I recommend routine visits with eye specialist to monitor for conditions such as glaucoma and cataracts.    Dental Health  I recommend routine dental visits for regular cleanings and check ups with X-Rays.    Dermatologic Health  I recommend regular skin checks - both personal (with aid of family  members) and by a medical professional.  I also recommend skin protection - either through application of sun screen or through clothing (hats, over garments) - when spending time out in the sun.

## 2020-01-13 ENCOUNTER — Encounter (HOSPITAL_BASED_OUTPATIENT_CLINIC_OR_DEPARTMENT_OTHER): Payer: Self-pay | Admitting: Family Medicine

## 2020-02-23 ENCOUNTER — Ambulatory Visit
Admission: RE | Admit: 2020-02-23 | Discharge: 2020-02-23 | Disposition: A | Discharge status: Home / Self Care | Payer: Medicare Other | Source: Ambulatory Visit | Attending: Family Medicine | Admitting: Family Medicine

## 2020-02-23 DIAGNOSIS — Z1231 Encounter for screening mammogram for malignant neoplasm of breast: Secondary | ICD-10-CM | POA: Insufficient documentation

## 2020-03-01 ENCOUNTER — Ambulatory Visit
Admission: RE | Admit: 2020-03-01 | Discharge: 2020-03-01 | Disposition: A | Discharge status: Home / Self Care | Source: Ambulatory Visit | Attending: Family Medicine | Admitting: Family Medicine

## 2020-03-01 DIAGNOSIS — Z1231 Encounter for screening mammogram for malignant neoplasm of breast: Secondary | ICD-10-CM | POA: Insufficient documentation

## 2020-03-01 DIAGNOSIS — R922 Inconclusive mammogram: Secondary | ICD-10-CM

## 2020-03-17 ENCOUNTER — Encounter (HOSPITAL_BASED_OUTPATIENT_CLINIC_OR_DEPARTMENT_OTHER): Payer: Self-pay | Admitting: Internal Medicine

## 2020-03-17 ENCOUNTER — Other Ambulatory Visit (HOSPITAL_BASED_OUTPATIENT_CLINIC_OR_DEPARTMENT_OTHER): Payer: Self-pay | Admitting: Internal Medicine

## 2020-03-17 DIAGNOSIS — Z23 Encounter for immunization: Secondary | ICD-10-CM

## 2020-07-02 ENCOUNTER — Ambulatory Visit (HOSPITAL_BASED_OUTPATIENT_CLINIC_OR_DEPARTMENT_OTHER): Payer: Self-pay | Admitting: Family Medicine

## 2020-08-03 ENCOUNTER — Encounter (HOSPITAL_BASED_OUTPATIENT_CLINIC_OR_DEPARTMENT_OTHER): Payer: Self-pay | Admitting: Family Medicine

## 2020-08-03 NOTE — Telephone Encounter (Signed)
From: Laurine Blazer  To: Reola Calkins, MD  Sent: 08/03/2020 3:19 PM PDT  Subject: Lab/blood work    Do I need to have blood work done before my visit this Monday 3/21?

## 2020-08-06 ENCOUNTER — Ambulatory Visit: Attending: Family Medicine

## 2020-08-06 DIAGNOSIS — M810 Age-related osteoporosis without current pathological fracture: Secondary | ICD-10-CM | POA: Insufficient documentation

## 2020-08-06 DIAGNOSIS — E559 Vitamin D deficiency, unspecified: Secondary | ICD-10-CM | POA: Insufficient documentation

## 2020-08-06 DIAGNOSIS — E785 Hyperlipidemia, unspecified: Secondary | ICD-10-CM | POA: Insufficient documentation

## 2020-08-06 LAB — VITAMIN D, 25 HYDROXY: Vitamin D, 25 Hydroxy: 34.4 ng/mL (ref 30–80)

## 2020-08-06 LAB — LIPID PANEL
Cholesterol: 245 mg/dL — ABNORMAL HIGH (ref 112–200)
HDL Cholesterol: 87 mg/dL (ref >=40)
LDL Cholesterol Calculation: 142 mg/dL — ABNORMAL HIGH (ref <100)
Non-HDL Cholesterol: 158 mg/dL — ABNORMAL HIGH (ref <=150.0)
Total Cholesterol: HDL Ratio: 2.8 mg/dL (ref 2.0–5.0)
Triglyceride: 79 mg/dL (ref 30–150)

## 2020-08-06 LAB — COMPREHENSIVE METABOLIC PANEL
Adjusted Calcium: 8.8 mg/dL (ref 8.7–10.2)
Alanine Transferase (ALT): 30 U/L (ref 4–56)
Alb/Glob Ratio: 1.7 — ABNORMAL HIGH (ref 1.0–1.6)
Albumin: 4.2 g/dL (ref 3.2–4.7)
Alkaline Phosphatase (ALP): 51 U/L (ref 38–126)
Aspartate Transaminase (AST): 27 U/L (ref 9–44)
BUN/ Creatinine: 34 — ABNORMAL HIGH (ref 7.3–21.7)
Bilirubin Total: 0.9 mg/dL (ref 0.1–2.2)
Calcium: 9 mg/dL (ref 8.7–10.2)
Carbon Dioxide Total: 25 mmol/L (ref 22–32)
Chloride: 104 mmol/L (ref 99–109)
Creatinine Serum: 0.53 mg/dL (ref 0.50–1.30)
E-GFR, Non-African American: 125 mL/min/{1.73_m2} (ref >60)
E-GFR: 144 mL/min/{1.73_m2} (ref >60)
Globulin: 2.5 g/dL (ref 2.2–4.2)
Glucose: 91 mg/dL (ref 70–99)
Potassium: 4.2 mmol/L (ref 3.5–5.2)
Protein: 6.7 g/dL (ref 5.9–8.2)
Sodium: 134 mmol/L (ref 134–143)
Urea Nitrogen, Blood (BUN): 18 mg/dL (ref 6–21)

## 2020-08-09 ENCOUNTER — Encounter (HOSPITAL_BASED_OUTPATIENT_CLINIC_OR_DEPARTMENT_OTHER): Payer: Self-pay | Admitting: Family Medicine

## 2020-08-09 ENCOUNTER — Ambulatory Visit (HOSPITAL_BASED_OUTPATIENT_CLINIC_OR_DEPARTMENT_OTHER): Admitting: Family Medicine

## 2020-08-09 ENCOUNTER — Ambulatory Visit: Attending: Family Medicine

## 2020-08-09 VITALS — BP 138/70 | HR 74 | Temp 98.0°F | Resp 16 | Ht 63.0 in | Wt 150.8 lb

## 2020-08-09 DIAGNOSIS — Z1211 Encounter for screening for malignant neoplasm of colon: Secondary | ICD-10-CM

## 2020-08-09 DIAGNOSIS — E785 Hyperlipidemia, unspecified: Secondary | ICD-10-CM

## 2020-08-09 DIAGNOSIS — E559 Vitamin D deficiency, unspecified: Secondary | ICD-10-CM

## 2020-08-09 DIAGNOSIS — J301 Allergic rhinitis due to pollen: Secondary | ICD-10-CM

## 2020-08-09 DIAGNOSIS — Z0000 Encounter for general adult medical examination without abnormal findings: Secondary | ICD-10-CM

## 2020-08-09 DIAGNOSIS — Z7189 Other specified counseling: Secondary | ICD-10-CM

## 2020-08-09 DIAGNOSIS — M810 Age-related osteoporosis without current pathological fracture: Secondary | ICD-10-CM

## 2020-08-09 MED ORDER — FLUTICASONE PROPIONATE 50 MCG/ACTUATION NASAL SPRAY,SUSPENSION
2.0000 | Freq: Every day | NASAL | 2 refills | Status: DC
Start: 2020-08-09 — End: 2022-07-31

## 2020-08-09 NOTE — Progress Notes (Signed)
HPI:  Rachel [name] is a 70 yr-old female who is here today for Routine HCM appointment with continued care and management of multiple medical issues.    She obtained labs prior to this appointment.  We reviewed and discussed the results in detail.  While there are abnormalities noted, the results otherwise revealed no new concerns or signficant changes to those abnormalities.  They indicate that various health conditions are stable and bring up no concerns.    We also discussed results of the DEXA Scan this revealed T-Scores to -1.8/-2.5, indicating concerns for Osteoporosis/Osteopenia.  We discussed treatment options as well.  However, she reported that there are concerns with bone mineralization in her jaw - "There are holes in my jaw".   This can very well be a contraindication to treatment, especially using Prolia.    I recommended discuss Fosamax further with the dentist and their recommendations. I also recommended Vitamin D/Calcium supplementation.   She was given the opportunity to ask questions.  These were answered to her stated satisfaction.  We will discuss this further during her next appointment.    She does not have other immediate health concerns today.  We discussed recent acute and other chronic health issues.  She did bring in a POLST Form and Five Wishes Document.      The POLST Form was completed with the exception of information linking it to the Advanced Directives (Five Wishes Document).  We reviewed both forms, discussed them at length (at least 16 minutes).      She was given the opportunity to ask questions.  These were answered to her stated satisfaction.  I signed the POLST Form, but recommended that she complete the Five Wishes Document prior to having both forms scanned into her EMR chart.  I also recommended that get the Five Wishes Document notarized rather than witnessed.  She agrees with these recommendations.    Patient is otherwise doing well.  Denies any concerns over  other chronic health conditions.  Denies any recent acute illnesses.  We discussed and made preparations for a [facility] Annual Exam.  A routine set of labs was ordered.  They can be obtained prior to next appointment.  They will be reviewed and discuss then.    ROS:  A Review of Systems was performed. Pertinent details discussed in HPI. Otherwise, denies any issues or other physical concerns    Vitals:  BP 138/70 (SITE: left arm, Orthostatic Position: sitting, Cuff Size: regular)   Pulse 74   Temp 36.7 C (98 F) (Temporal)   Resp 16   Ht 1.6 m (5\' 3" )   Wt 68.4 kg (150 lb 12.8 oz)   SpO2 98%   BMI 26.71 kg/m     Physical Exam  Constitutional:       Appearance: She is well-developed.   HENT:      Head: Normocephalic and atraumatic.   Eyes:      Conjunctiva/sclera: Conjunctivae normal.      Pupils: Pupils are equal, round, and reactive to light.   Cardiovascular:      Rate and Rhythm: Normal rate and regular rhythm.      Heart sounds: Normal heart sounds. No murmur heard.    No friction rub. No gallop.   Pulmonary:      Effort: Pulmonary effort is normal. No respiratory distress.      Breath sounds: Normal breath sounds. No wheezing or rales.   Abdominal:      General: Bowel sounds  are normal. There is no distension.      Palpations: Abdomen is soft. There is no mass.      Tenderness: There is no abdominal tenderness.   Musculoskeletal:         General: Normal range of motion.      Cervical back: Normal range of motion.   Skin:     General: Skin is warm and dry.   Neurological:      Mental Status: She is alert and oriented to person, place, and time.   Psychiatric:         Behavior: Behavior normal.         Thought Content: Thought content normal.         Judgment: Judgment normal.     Assessment and Plan:    ICD-10-CM    1. Healthcare maintenance  Z00.00    2. Dyslipidemia  E78.5 Comprehensive Metabolic Panel     Lipid Panel   3. Senile osteoporosis  M81.0 Comprehensive Metabolic Panel     Vitamin D, 25  Hydroxy   4. Vitamin D deficiency  E55.9 Vitamin D, 25 Hydroxy   5. Chronic seasonal allergic rhinitis due to pollen  J30.1 Fluticasone (FLONASE ALLERGY RELIEF) 50 mcg/actuation nasal spray   6. Counseling regarding end of life decision making  Z71.89      RTC in 5 Months for [facility] Annual Exam.

## 2020-08-09 NOTE — Patient Instructions (Signed)
Alendronate  Pronunciation: a LEN dro nate   Brand: Fosamax   What is the most important information I should know about alendronate?   Do not take an alendronate tablet if you cannot sit upright or stand for at least 30 minutes. Alendronate can cause serious problems in the stomach or esophagus (the tube that connects your mouth and stomach). You will need to stay upright for at least 30 minutes after taking this medication.   Take the alendronate tablet first thing in the morning, at least 30 minutes before you eat or drink anything or take any other medicine.   Take each dose with a full glass (6 to 8 ounces) of water. Use only plain water (not mineral water, coffee, tea, or juice) when taking an alendronate tablet.   For at least the first 30 minutes after taking an alendronate tablet, do not lie down or recline; do not eat or drink anything other than plain water; and do not take any other medicines including vitamins, calcium, or antacids.  Some people using medicines similar to alendronate have developed bone loss in the jaw, also called osteonecrosis of the jaw. Symptoms may include jaw pain, swelling, numbness, loose teeth, gum infection, or slow healing after injury or surgery involving the gums. You may be more likely to develop osteonecrosis of the jaw if you have cancer or have been treated with chemotherapy, radiation, or steroids. Other conditions associated with osteonecrosis of the jaw include blood clotting disorders, anemia (low red blood cells), and a pre-existing dental problem.   If you need to have any dental work (especially surgery), tell the dentist ahead of time that you are using alendronate. You may need to stop using the medicine for a short time.   Talk with your doctor about the risks and benefits of using this medication.  What is alendronate?  Alendronate is in the group of medicines called bisphosphonates (bis FOS fo nayts). It alters the cycle of bone formation and breakdown in  the body. Alendronate slows bone loss while increasing bone mass, which may prevent bone fractures.  Alendronate is used in men and women to treat or prevent osteoporosis that is caused by menopause or by taking steroids. Alendronate is also used to increase bone mass in men who have osteoporosis, and to treat Paget's disease of bone in men and women.  Alendronate may also be used for purposes not listed in this medication guide.  What should I discuss with my healthcare provider before taking alendronate?   Do not take an alendronate tablet if you cannot sit upright or stand for at least 30 minutes. Alendronate can cause serious problems in the stomach or esophagus (the tube that connects your mouth and stomach). You will need to stay upright for at least 30 minutes after taking this medication.    You should not take alendronate if you are allergic to it, or if you have low levels of calcium in your blood (hypocalcemia), or a problem with the movement of muscles in your esophagus.   To make sure you can safely take alendronate, tell your doctor if you have any of these other conditions:   trouble swallowing;   a vitamin D deficiency;   a dental problem;   kidney disease; or   an ulcer or other problem in your stomach or esophagus.  Some people using medicines similar to alendronate have developed bone loss in the jaw, also called osteonecrosis of the jaw. Symptoms may include jaw pain, swelling,  numbness, loose teeth, gum infection, or slow healing after injury or surgery involving the gums.  You may be more likely to develop osteonecrosis of the jaw if you have cancer or have been treated with chemotherapy, radiation, or steroids. Other conditions associated with osteonecrosis of the jaw include blood clotting disorders, anemia (low red blood cells), and dental surgery or pre-existing dental problems.  Talk with your doctor about the risks and benefits of using this medication.   FDA pregnancy category C. It  is not known whether alendronate will harm an unborn baby. Tell your doctor if you are pregnant or plan to become pregnant while using this medication.    It is not known whether alendronate passes into breast milk or if it could harm a nursing baby. Do not use this medication without telling your doctor if you are breast-feeding a baby.   How should I take alendronate?  Take exactly as prescribed by your doctor. Do not take in larger or smaller amounts or for longer than recommended. Follow the directions on your prescription label.  Alendronate tablets are taken either once each day or once each week.  Take the alendronate tablet first thing in the morning, at least 30 minutes before you eat or drink anything or take any other medicine. If you take an alendronate tablet only once a week, take it on the same day each week and always first thing in the morning.   Take each alendronate tablet with a full glass (6 to 8 ounces) of water. Use only plain water (not mineral water) when taking an alendronate tablet.    Do not crush, chew, or suck the alendronate tablet. Swallow the pill whole.   After taking an alendronate tablet, carefully follow these instructions:   Do not lie down or recline for at least 30 minutes after taking alendronate.   Do not eat or drink anything other than plain water.   Do not take any other medicines including vitamins, calcium, or antacids for at least 30 minutes after taking alendronate. It may be best to take your other medicines at a different time of the day. Talk with your doctor about the best dosing schedule for your other medicines.  To be sure this medication is helping your condition, your bone mineral density will need to be tested on a regular basis. Visit your doctor regularly.   If you need to have any dental work (especially surgery), tell the dentist ahead of time that you are using alendronate. You may need to stop using the medicine for a short time.   Alendronate is  only part of a complete program of treatment that may also include diet changes, exercise, and taking calcium and vitamin supplements. Follow your diet, medication, and exercise routines very closely.   Store at room temperature away from moisture and heat.   What happens if I miss a dose?  If you take alendronate tablets once daily: If you forget to take this medicine first thing in the morning, do not take it later in the day. Wait until the following morning to take the medicine and skip the missed dose. Do not take two (2) tablets in one day.  If you take alendronate tablets once a week: If you forget to take alendronate on your scheduled day, take it first thing in the morning on the day after you remember the missed dose. Then return to your regular weekly schedule on your chosen dose day. Do not take two (2) tablets   in one day.  What happens if I overdose?   Seek emergency medical attention or call the Poison Help line at 1-800-222-1222. Drink a full glass of milk and call your local poison control center or emergency room right away. Do not make yourself vomit and do not lie down.   Overdose symptoms may include nausea, heartburn, stomach pain, diarrhea, muscle cramps, numbness or tingling, tight muscles in your face, seizure (convulsions), irritability, and unusual thoughts or behavior.  What should I avoid while taking alendronate?   Avoid taking any other medicines including vitamins, calcium, or antacids for at least 30 minutes after taking an alendronate tablet. Some medicines can make it harder for your body to absorb alendronate.    Do not lie down for at least 30 minutes after you take an alendronate tablet.   What are the possible side effects of alendronate?   Get emergency medical help if you have any of these signs of an allergic reaction: hives; difficulty breathing; swelling of your face, lips, tongue, or throat.    Stop using alendronate and call your doctor at once if you have any of these  serious side effects:    chest pain;   difficulty or pain when swallowing;   pain or burning under the ribs or in the back;   severe heartburn, burning pain in your upper stomach, or coughing up blood;   new or worsening heartburn;   fever, body aches, flu symptoms;   severe joint, bone, or muscle pain;   new or unusual pain in your thigh or hip;   jaw pain, numbness, or swelling.  Less serious side effects may include:   mild heartburn, bloating;   mild nausea, vomiting, or stomach pain;   diarrhea, gas, or constipation;   mild joint pain or swelling;   swelling in your hands or feet; or   dizziness, eye pain, headache.  This is not a complete list of side effects and others may occur. Call your doctor for medical advice about side effects. You may report side effects to FDA at 1-800-FDA-1088.  What other drugs will affect alendronate?  Tell your doctor about all other medicines you use, especially aspirin or other NSAIDs (non-steroidal anti-inflammatory drugs) such as:   celecoxib (Celebrex);   diclofenac (Voltaren);   diflunisal (Dolobid);   ibuprofen (Motrin, Advil);   indomethacin (Indocin);   ketoprofen (Orudis)   ketorolac (Toradol);   naproxen (Aleve, Naprosyn); or   piroxicam (Feldene), and others.  This list is not complete and other drugs may interact with alendronate. Tell your doctor about all medications you use. This includes prescription, over-the-counter, vitamin, and herbal products. Do not start a new medication without telling your doctor.  Where can I get more information?  Your pharmacist can provide more information about alendronate.    Remember, keep this and all other medicines out of the reach of children, never share your medicines with others, and use this medication only for the indication prescribed.  Every effort has been made to ensure that the information provided by [employer] ('[employer]') is accurate, up-to-date, and complete, but no guarantee is  made to that effect. Drug information contained herein may be time sensitive. [employer] information has been compiled for use by healthcare practitioners and consumers in the [place] and therefore [employer] does not warrant that uses outside of the [place] are appropriate, unless specifically indicated otherwise. [employer]'s drug information does not endorse drugs, diagnose patients or recommend therapy. [employer]'s drug information   is an Investment banker, corporate to Museum/gallery curator in caring for their patients and/or to serve consumers viewing this service as a supplement to, and not a substitute for, the expertise, skill, knowledge and judgment of healthcare practitioners. The absence of a warning for a given drug or drug combination in no way should be construed to indicate that the drug or drug combination is safe, effective or appropriate for any given patient. Mult[employer] does not assume any responsibility for any aspect of healthcare administered with the aid of information [employer] provides. The information contained herein is not intended to cover all possible uses, directions, precautions, warnings, drug interactions, allergic reactions, or adverse effects. If you have questions about the drugs you are taking, check with your doctor, nurse or pharmacist.  Copyright [phone] [employer] Version: 10.01. Revision date: 12/13/2009.  This information does not replace the advice of a doctor. [employer] disclaims any warranty or liability for your use of this information.   Content Version: 9.7.145117      Denosumab (Prolia)  Pronunciation: den [place] sue mab   Brand: Prolia   What is the most important information I should know about Prolia?  This medication guide provides information about the Prolia brand of denosumab. Rachel [name] is another brand of denosumab used to prevent bone fractures and other skeletal conditions in people with tumors that have spread to the bone.    You should not receive denosumab if you are allergic to it, or if you have low levels of calcium in your blood (hypocalcemia).   Before you receive this medication, tell your doctor if you have kidney disease (or if you are on dialysis), a weak immune system, a history of hypoparathyroidism or thyroid surgery, a history of intestinal surgery, a condition that makes it hard for your body to absorb nutrients from food, or if you are allergic to latex.   Serious infections may occur during treatment with Prolia. Call your doctor right away if you have signs of infection such as: severe skin irritation; swelling or redness anywhere on your body; pain or burning when you urinate; severe stomach pain; ear pain, trouble hearing; cough, feeling short of breath; purple or red spots under your skin; or fever, chills, night sweats, flu symptoms, or weight loss.   Some people using denosumab have developed bone loss in the jaw, also called osteonecrosis of the jaw. Symptoms may include jaw pain, swelling, numbness, loose teeth, gum infection, or slow healing after injury or surgery involving the gums. You may be more likely to develop osteonecrosis of the jaw if you have cancer or have been treated with chemotherapy, radiation, or steroids. Other conditions associated with osteonecrosis of the jaw include blood clotting disorders, anemia (low red blood cells), and a pre-existing dental problem.   If you need to have any dental work (especially surgery), tell the dentist ahead of time that you are receiving denosumab. You may need to stop using the medicine for a short time.   What is denosumab (Prolia)?  Denosumab is a monoclonal antibody. Monoclonal antibodies are made to target and destroy only certain cells in the body. This may help to protect healthy cells from damage.  The Prolia brand of denosumab is used to treat osteoporosis in postmenopausal women who have high risk of bone fracture.  Prolia is also used to increase  bone mass in women and men with a high risk of bone fracture caused by receiving treatments for certain types of cancer.  This medication guide provides information about the Prolia brand of denosumab. Rachel BarbaraXgeva is another brand of denosumab used to prevent bone fractures and other skeletal conditions in people with tumors that have spread to the bone.  Denosumab may also be used for purposes not listed in this medication guide.  What should I discuss with my health care provider before receiving Prolia?   You should not receive denosumab if you are allergic to it, or if you have low levels of calcium in your blood (hypocalcemia).   To make sure you can safely use Prolia, tell your doctor if you have any of these other conditions:   kidney disease (or if you are on dialysis);   a weak immune system (caused by disease or by using certain medicines);   a history of hypoparathyroidism (decreased functioning of the parathyroid glands);   a history of thyroid surgery;   a history of surgery to remove part of your intestine;   any condition that makes it hard for your body to absorb nutrients from food (malabsorption); or   if you are allergic to latex.  Some people using denosumab have developed bone loss in the jaw, also called osteonecrosis of the jaw. Symptoms may include jaw pain, swelling, numbness, loose teeth, gum infection, or slow healing after injury or surgery involving the gums. You may be more likely to develop osteonecrosis of the jaw if you have cancer or have been treated with chemotherapy, radiation, or steroids. Other conditions associated with osteonecrosis of the jaw include blood clotting disorders, anemia (low red blood cells), and a pre-existing dental problem.   FDA pregnancy category C. It is not known whether denosumab will harm an unborn baby. Tell your doctor if you are pregnant or plan to become pregnant while using this medication.   If you are pregnant, your name may be listed on a  pregnancy registry. This is to track the outcome of the pregnancy and to evaluate any effects of denosumab on the baby.   It is not known whether denosumab passes into breast milk or if it could harm a nursing baby. However, this medication may slow the production of breast milk. You should not breast-feed while receiving denosumab.   How is Prolia given?  Denosumab is injected under the skin of your stomach, upper thigh, or upper arm. A healthcare provider will give you this injection.  Prolia is usually given once every 6 months. Follow your doctor's instructions.  Your doctor may have you take extra calcium and vitamin D while you are being treated with denosumab. Take only the amount of calcium and vitamin D that your doctor has prescribed.  Pay special attention to your dental hygiene. Brush and floss your teeth regularly while receiving this medication. You may need to have a dental exam before you begin treatment with Prolia. Follow your doctor's instructions.   If you need to have any dental work (especially surgery), tell the dentist ahead of time that you are receiving denosumab. You may need to stop using the medicine for a short time.    If you keep your medication at home, store it in the original container in a refrigerator. Protect from light and do not freeze.   You may take the medicine out of the refrigerator and allow it to reach room temperature before the injection is given. Do not heat the medicine before using.   After you have taken Prolia out of the refrigerator, you may keep it at room temperature  for up to 14 days. Store in the original container away from heat and light.    Do not shake the prefilled syringe or you may ruin the medicine. Do not use the medication if it looks cloudy or has particles in it. Call your doctor for a new prescription.   Each prefilled syringe of this medicine is for one use only. Throw away after one use, even if there is still some medicine left in it after  injecting your dose.  Do not share this medication with another person, even if they have the same symptoms you have.  What happens if I miss a dose?  Call your doctor for instructions if you miss an appointment for your Prolia injection. You should receive your missed injection as soon as possible.  What happens if I overdose?   Seek emergency medical attention or call the Poison Help line at [phone].   What should I avoid while receiving Prolia?  Follow your doctor's instructions about any restrictions on food, beverages, or activity.  What are the possible side effects of Prolia?   Get emergency medical help if you have any of these signs of an allergic reaction: hives; difficulty breathing; swelling of your face, lips, tongue, or throat.    Serious infections may occur during treatment with Prolia. Call your doctor right away if you have signs of infection such as:    severe itching, burning, rash, blistering, peeling, or dryness of the skin;   swelling, pain, tenderness, warmth, or redness anywhere on your body;   pain or burning when you urinate, blood in your urine;   severe stomach pain;   ear pain or drainage, trouble hearing;   fever, chills, night sweats;   cough, feeling short of breath;   pinpoint purple or red spots under your skin; or   flu symptoms, weight loss.   Call your doctor at once if you have a serious side effect such as:    numbness or tingly feeling around your mouth or in your fingers or toes, fast or slow heart rate, muscle cramps or contraction, overactive reflexes; or   severe pain in your upper stomach spreading to your back, nausea and vomiting, fast heart rate.  Less serious side effects may include:   weakness;   constipation;   back pain, muscle pain; or   pain in your arms or legs.  This is not a complete list of side effects and others may occur. Call your doctor for medical advice about side effects. You may report side effects to FDA at  1-800-FDA-1088.  What other drugs will affect Prolia?  Tell your doctor about all other medicines you use, especially drugs that weaken your immune system such as:   steroids or cancer medicine;   cyclosporine (Neoral, Sandimmune, Gengraf);   sirolimus (Rapamune), tacrolimus (Prograf);   basiliximab (Simulect), muromonab-CD3 (Orthoclone);   mycophenolate mofetil (CellCept); or   azathioprine (Imuran), leflunomide (Arava), etanercept (Enbrel).  This list is not complete and other drugs may interact with denosumab. Tell your doctor about all medications you use. This includes prescription, over-the-counter, vitamin, and herbal products. Do not start a new medication without telling your doctor.  Where can I get more information?  Your doctor or pharmacist can provide more information about denosumab (Prolia).    Remember, keep this and all other medicines out of the reach of children, never share your medicines with others, and use this medication only for the indication prescribed.  Every effort has been  made to ensure that the information provided by [employer] ('[employer]') is accurate, up-to-date, and complete, but no guarantee is made to that effect. Drug information contained herein may be time sensitive. [employer] information has been compiled for use by healthcare practitioners and consumers in the [place] and therefore [employer] does not warrant that uses outside of the [place] are appropriate, unless specifically indicated otherwise. [employer]'s drug information does not endorse drugs, diagnose patients or recommend therapy. [employer]'s drug information is an Investment banker, corporate to assist licensed healthcare practitioners in caring for their patients and/or to serve consumers viewing this service as a supplement to, and not a substitute for, the expertise, skill, knowledge and judgment of healthcare practitioners. The absence of a warning for a given drug or drug combination in no way  should be construed to indicate that the drug or drug combination is safe, effective or appropriate for any given patient. [employer] does not assume any responsibility for any aspect of healthcare administered with the aid of information [employer] provides. The information contained herein is not intended to cover all possible uses, directions, precautions, warnings, drug interactions, allergic reactions, or adverse effects. If you have questions about the drugs you are taking, check with your doctor, nurse or pharmacist.  Copyright [phone] [employer] Version: 8.01. Revision date: 03/04/2010.  This information does not replace the advice of a doctor. [employer] disclaims any warranty or liability for your use of this information.   Content Version: 9.7.145117

## 2020-08-11 LAB — MMC FIT: Fecal immunochemical test: NEGATIVE

## 2020-12-31 ENCOUNTER — Ambulatory Visit: Attending: Family Medicine

## 2020-12-31 DIAGNOSIS — M810 Age-related osteoporosis without current pathological fracture: Secondary | ICD-10-CM

## 2020-12-31 DIAGNOSIS — E785 Hyperlipidemia, unspecified: Secondary | ICD-10-CM

## 2020-12-31 DIAGNOSIS — E559 Vitamin D deficiency, unspecified: Secondary | ICD-10-CM | POA: Insufficient documentation

## 2020-12-31 LAB — LIPID PANEL
Cholesterol: 275 mg/dL — ABNORMAL HIGH (ref 112–200)
HDL Cholesterol: 81 mg/dL (ref >=40)
LDL Cholesterol Calculation: 179 mg/dL — ABNORMAL HIGH (ref <100)
Non-HDL Cholesterol: 194 mg/dL — ABNORMAL HIGH (ref <=150.0)
Total Cholesterol: HDL Ratio: 3.4 mg/dL (ref 2.0–5.0)
Triglyceride: 76 mg/dL (ref 30–150)

## 2020-12-31 LAB — COMPREHENSIVE METABOLIC PANEL
Adjusted Calcium: 8.9 mg/dL (ref 8.7–10.2)
Alanine Transferase (ALT): 42 U/L (ref 4–56)
Alb/Glob Ratio: 1.4 (ref 1.0–1.6)
Albumin: 4.2 g/dL (ref 3.2–4.7)
Alkaline Phosphatase (ALP): 53 U/L (ref 38–126)
Aspartate Transaminase (AST): 24 U/L (ref 9–44)
BUN/ Creatinine: 36.2 — ABNORMAL HIGH (ref 7.3–21.7)
Bilirubin Total: 0.9 mg/dL (ref 0.1–2.2)
Calcium: 9.1 mg/dL (ref 8.7–10.2)
Carbon Dioxide Total: 26 mmol/L (ref 22–32)
Chloride: 102 mmol/L (ref 99–109)
Creatinine Serum: 0.47 mg/dL — ABNORMAL LOW (ref 0.50–1.30)
E-GFR, Non-African American: 145 mL/min/{1.73_m2} (ref >60)
E-GFR: 167 mL/min/{1.73_m2} (ref >60)
Globulin: 2.9 g/dL (ref 2.2–4.2)
Glucose: 96 mg/dL (ref 70–99)
Potassium: 3.7 mmol/L (ref 3.5–5.2)
Protein: 7.1 g/dL (ref 5.9–8.2)
Sodium: 135 mmol/L (ref 134–143)
Urea Nitrogen, Blood (BUN): 17 mg/dL (ref 6–21)

## 2020-12-31 LAB — VITAMIN D, 25 HYDROXY: Vitamin D, 25 Hydroxy: 35.1 ng/mL (ref 30–80)

## 2021-01-04 IMAGING — US US NON OB PELVIC TRANSABDOMINAL
1 series · 7 of 7 positions shown · non-contrast
Comparison: None.

HISTORY: 68-year-old Female with anemia
TECHNIQUE: Transabdominal evaluation of the pelvis was performed.

[Series 2: us non ob pelvic transabdominal · 7 of 7 slices shown]
[im 1/7]
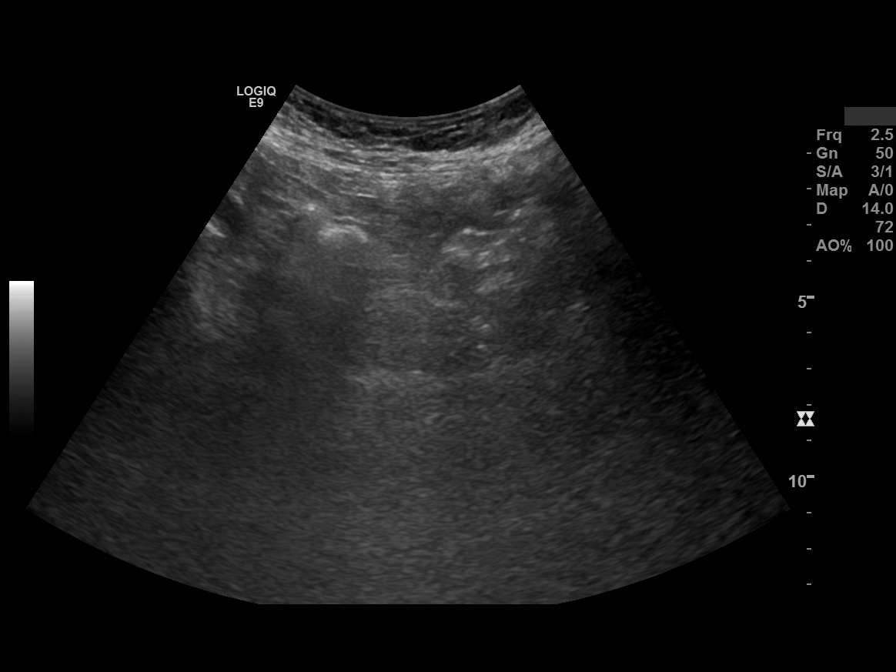
[im 2/7]
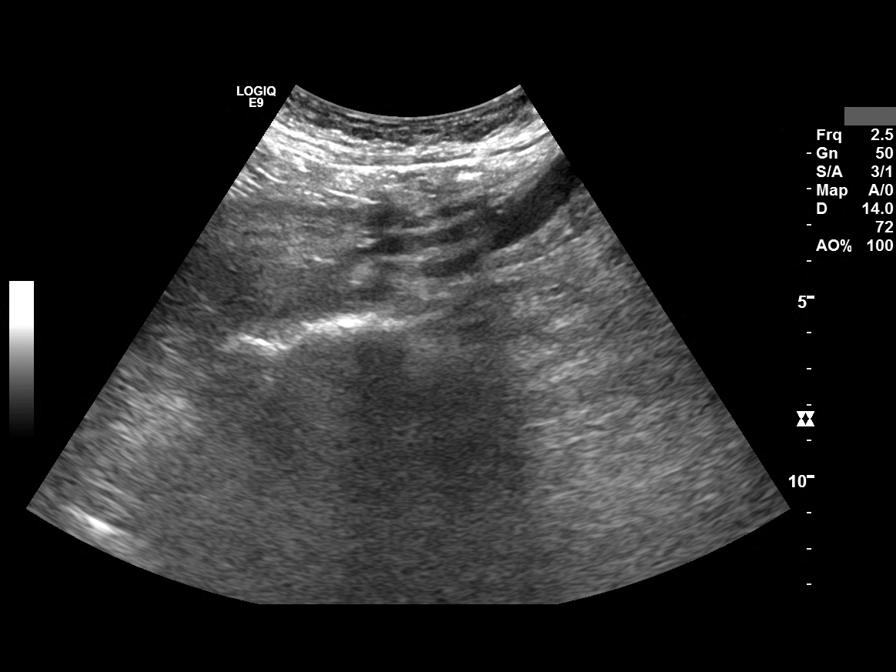
[im 3/7]
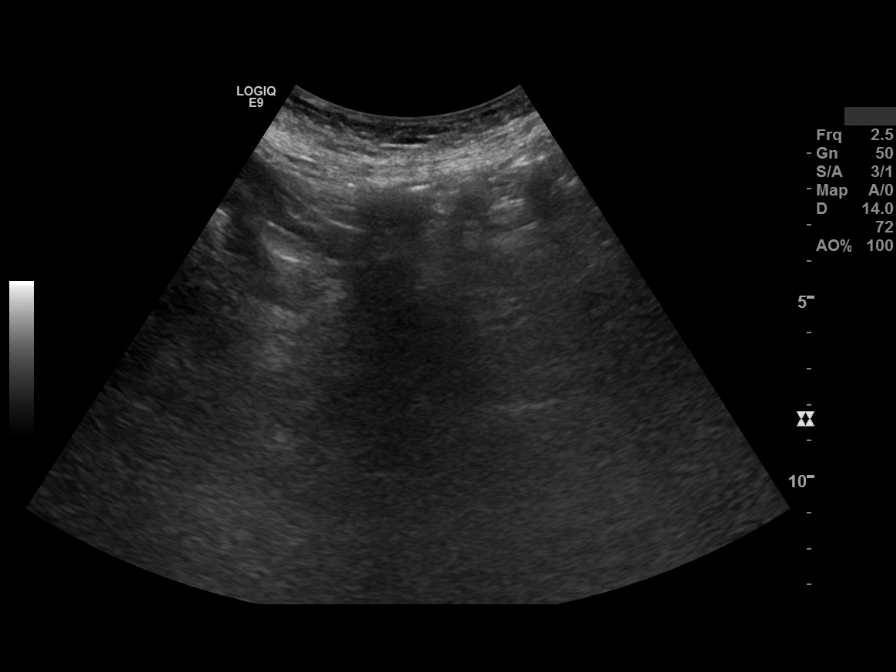
[im 4/7]
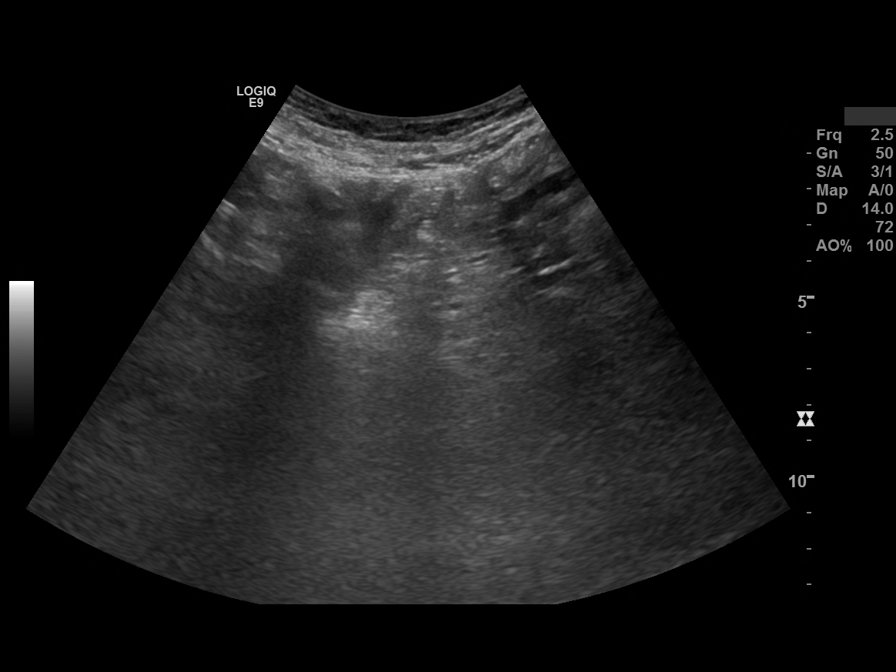
[im 5/7]
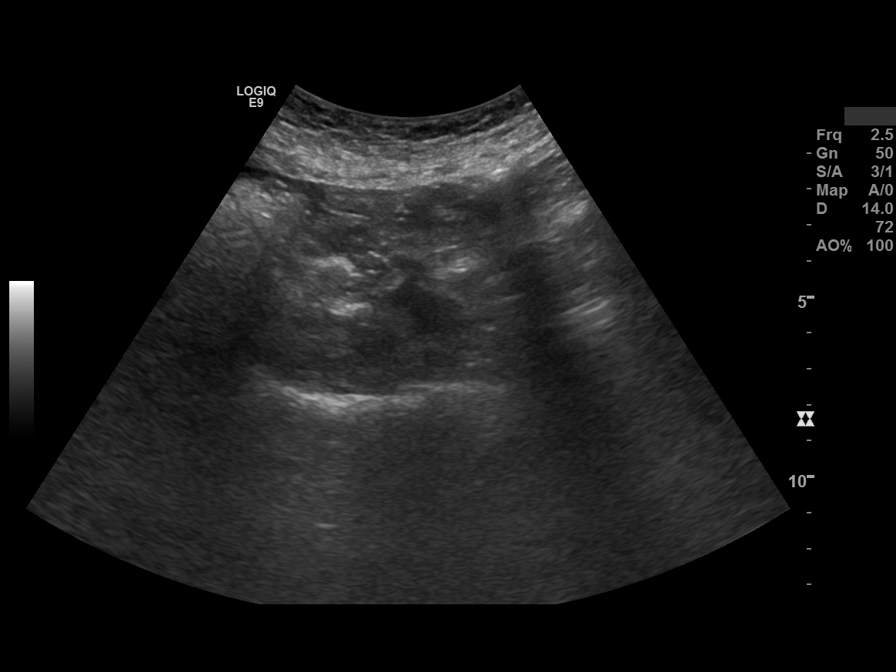
[im 6/7]
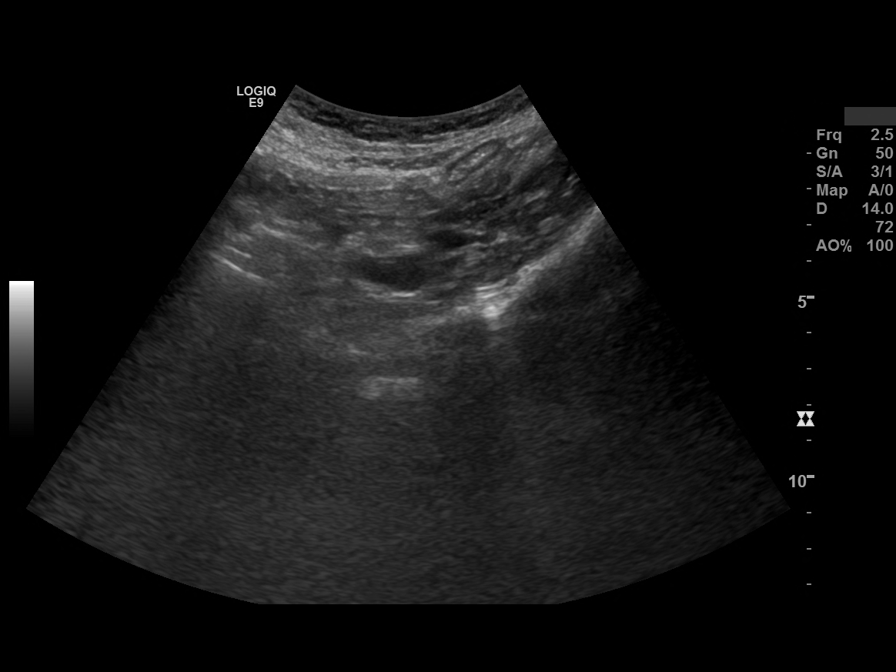
[im 7/7]
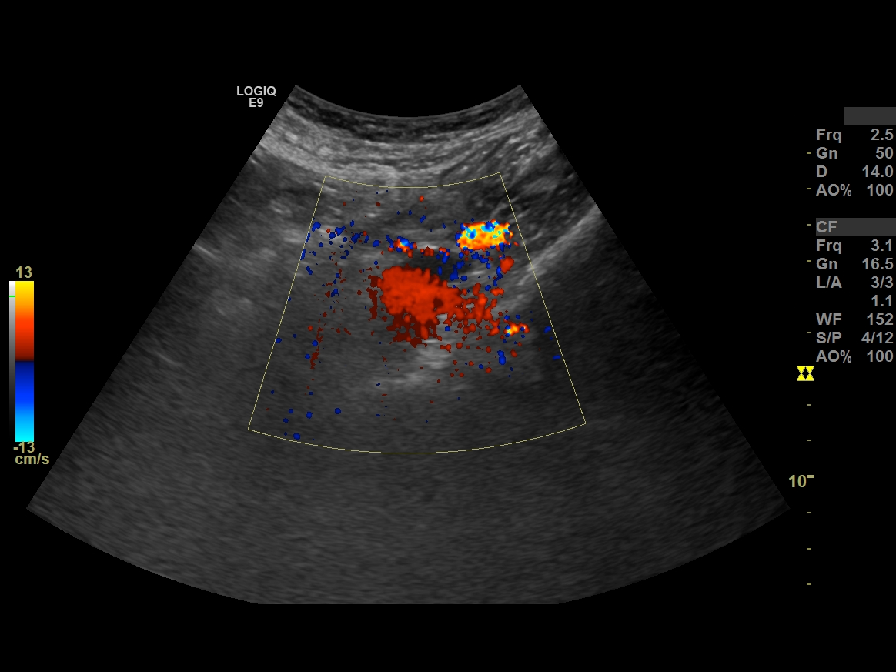

[7 of 7 positions shown; findings below may reference images not displayed]

FINDINGS: The uterus and both ovaries are not identified. Patient gave history of hysterectomy in 5723. There is no ultrasound evidence of a pelvic mass or fluid collection.
IMPRESSION: 1. Status post hysterectomy in 5723 by history. The uterus and both ovaries are not identified.

2. No ultrasound evidence of a pelvic mass or fluid collection.

## 2021-01-10 ENCOUNTER — Encounter (HOSPITAL_BASED_OUTPATIENT_CLINIC_OR_DEPARTMENT_OTHER): Payer: Self-pay | Admitting: Family Medicine

## 2021-01-13 ENCOUNTER — Ambulatory Visit (HOSPITAL_BASED_OUTPATIENT_CLINIC_OR_DEPARTMENT_OTHER): Admitting: Family Medicine

## 2021-01-13 ENCOUNTER — Encounter (HOSPITAL_BASED_OUTPATIENT_CLINIC_OR_DEPARTMENT_OTHER): Payer: Self-pay | Admitting: Family Medicine

## 2021-01-13 VITALS — BP 124/64 | HR 79 | Temp 97.9°F | Resp 12 | Wt 150.2 lb

## 2021-01-13 DIAGNOSIS — Z1231 Encounter for screening mammogram for malignant neoplasm of breast: Secondary | ICD-10-CM

## 2021-01-13 DIAGNOSIS — M810 Age-related osteoporosis without current pathological fracture: Secondary | ICD-10-CM

## 2021-01-13 DIAGNOSIS — M159 Polyosteoarthritis, unspecified: Secondary | ICD-10-CM

## 2021-01-13 DIAGNOSIS — K580 Irritable bowel syndrome with diarrhea: Secondary | ICD-10-CM

## 2021-01-13 DIAGNOSIS — H6981 Other specified disorders of Eustachian tube, right ear: Secondary | ICD-10-CM

## 2021-01-13 DIAGNOSIS — E785 Hyperlipidemia, unspecified: Secondary | ICD-10-CM

## 2021-01-13 DIAGNOSIS — Z1389 Encounter for screening for other disorder: Secondary | ICD-10-CM

## 2021-01-13 DIAGNOSIS — Z7185 Encounter for immunization safety counseling: Secondary | ICD-10-CM

## 2021-01-13 DIAGNOSIS — K5730 Diverticulosis of large intestine without perforation or abscess without bleeding: Secondary | ICD-10-CM

## 2021-01-13 DIAGNOSIS — Z0000 Encounter for general adult medical examination without abnormal findings: Secondary | ICD-10-CM

## 2021-01-13 DIAGNOSIS — Z1331 Encounter for screening for depression: Secondary | ICD-10-CM

## 2021-01-13 DIAGNOSIS — J301 Allergic rhinitis due to pollen: Secondary | ICD-10-CM

## 2021-01-13 DIAGNOSIS — Z7189 Other specified counseling: Secondary | ICD-10-CM

## 2021-01-13 DIAGNOSIS — E559 Vitamin D deficiency, unspecified: Secondary | ICD-10-CM

## 2021-01-13 NOTE — Progress Notes (Signed)
"You have access to this medical note to help you improve your understanding of what was discussed during the office visit.  If you have questions about the meaning or medical terminology being used, please bring it up at your next follow up appointment or send us a MyChart message with your questions. Medical notes are meant to be a communication tool between medical professionals and require medical terminology to be used for efficiency".    HPI:  Rachel [name] is a 70 yr-old female who is here today for a Welcome to [facility] Exam.    We reviewed PMH/PSH, family and social histories.  I obtained further information when needed and entered it into the EMR chart.    She has no healthcare needs or issues that need to be immediately addressed.    Patient obtained labs prior to this appointment. We reviewed and discussed the results in detail.     TCh, LDL and non-HDL levels are elevated.  While HDL is also elevated to the level of being protective and the ratio is within normal limits, the level of LDL (179 mg/dL) still represents a cardiac risk.    The results otherwise revealed no concerns or signficant abnormalities.  They indicate that various health conditions are stable.     We discussed the concerns as well as management options moving forward.  I recommended consideration for a statin.  She would rather work on changing the lipid panel through lifestyle choices first.    Throughout the discussion, she was given an opportunity to ask questions.  These were answered to her stated satisfaction.  She verbalized understanding and a willingness to follow through with the plan of care.  Repeat labs were ordered.    Patient is otherwise doing well.  Denies any concerns over other chronic health conditions.  Denies any recent acute illnesses.    ROS:  A Review of Systems performed. Pertinent details discussed in HPI. Otherwise, denies any issues or other physical concerns    Vital Signs:  BP 124/64 (SITE: left  arm, Orthostatic Position: sitting, Cuff Size: regular)   Pulse 79   Temp 36.6 C (97.9 F) (Temporal)   Resp 12   Wt 68.1 kg (150 lb 3.2 oz)   SpO2 95%   BMI 26.61 kg/m     Physical Exam  Constitutional:       Appearance: She is well-developed.   HENT:      Head: Normocephalic and atraumatic.   Eyes:      Conjunctiva/sclera: Conjunctivae normal.      Pupils: Pupils are equal, round, and reactive to light.   Cardiovascular:      Rate and Rhythm: Normal rate and regular rhythm.      Heart sounds: Normal heart sounds. No murmur heard.    No friction rub. No gallop.   Pulmonary:      Effort: Pulmonary effort is normal. No respiratory distress.      Breath sounds: Normal breath sounds. No wheezing or rales.   Abdominal:      General: Bowel sounds are normal. There is no distension.      Palpations: Abdomen is soft. There is no mass.      Tenderness: There is no abdominal tenderness.   Musculoskeletal:         General: Normal range of motion.      Cervical back: Normal range of motion.   Skin:     General: Skin is warm and dry.   Neurological:  Mental Status: She is alert and oriented to person, place, and time.   Psychiatric:         Behavior: Behavior normal.         Thought Content: Thought content normal.         Judgment: Judgment normal.     Preventative Healthcare     Preventative Questionnaires  Medicare Questionnaire (Health Risk Assessment) -Scanned into patient's medical record.    All questionnaires were reviewed with the patient and the appropriate counseling was discussed.     Reviewed allergies, past medical history, past surgical history, family history, social history, and current medications and supplements.     Advanced Care Planning  The patient and I reviewed the Advance Directive / POLST. The documents in EMR are up to date.  Advanced Directives are dated 03/27/2005.  POLST Form is dated 08/08/2020.  I assured the patient that such planning is voluntary and not a requirement to receive  medical care.    Vaccinations  - Flu vaccination:  Sporadically.  Last one on record - 02/23/2020  - SARS-Cov2 vaccaintion:  Moderna - 07/25/2019; 08/22/2019; 05/20/2020; 09/21/2020  - Pneumococcal vaccination:     - PPSV23:  12/27/2018   - PCV13:  09/19/2017  - Td/TdaP vaccination:  TdaP - 08/11/2015  - History of chicken pox:  Unsure  - History of Shingles Outbreak:  No  - Shingles Vaccination:  Zostavax - 08/11/2015.  Discussed Shingrix  - Hepatitis A/B Vaccination:  Discussed Twinrix    Cardiovascular Health  An EKG was during the Welcome to [facility] Exam.  It revealed NSR.  Axises, wave intervals and wave morphology were all within expect parameters.    Pulmonary Health  Due to patient's smoking history, a Lung Cancer Screen (Low-dose Chest CT) is not appropriate.      Routine [facility] provided by Cassell Clement.  - LMP:  ~2011  - Last Pap/pelvic:  04/08/2015.  Recommended returning to Gyn       - h/o abnormal exam:  No  - Last Mammogram:  02/23/2020.  Mammogram ordered.       - h/o abnormal mammograms:  No    Bone Health  - Last DEXA Scan:  05/12/2019  DEXA Scan due in 04/2021  - LaResults:  Osteoporosis with a T-Score to -2.5    GI Health  - Last Colon Cancer Screening via colonoscopy in 05/03/2012       - Monitoring schedule:  Next colon cancer screen due in 04/2022   - Hepatitis C Screen:  Refused    Ocular Health  - Routine visits with eye specialist:  Annual exams with glaucoma screens  We discussed the importance of routine eye care to monitor for conditions such as glaucoma and cataracts.    Dental Health   - Routine dental visits:  Semiannual cleanings.  Annual check ups with X-Rays   We discussed the importance for continued dental care.    Dermatologic Health  - Routine visits to a Dermatologist:  Established with Dr. No.  Routine skin checks performed.  We discussed the importance of regular skin checks - both personal (with aid of family members) and by a medical  professional.  We also discussed the need for skin protection from the sun.    Miscellaneous Preventative Healthcare Maintenance Items    The patient is negative for cognitive impairment.     Opioid Risk Screening: N/A    Substance Use Screening: N/A    Hearing/Fall  Prevention: Discussed with patient. See orders as appropriate.    Stop Smoking Counseling: n/a    Reduce Alcohol Intake Counseling:yes    Exercise and Nutrition Counseling: Discussed with patient and handouts provided.    A written updated summary of Medicare Annual Wellness Visit was given to this patient. It details what healthcare maintenance has been done and when they are due for their next subsequent Medicare Annual Wellness Visit.    Assessment and Plan:    ICD-10-CM    1. Encounter for Medicare annual wellness exam  Z00.00       2. Counseling regarding end of life decision making  Z71.89       3. Immunization counseling  Z71.85       4. Dyslipidemia  E78.5 Comprehensive Metabolic Panel     Lipid Panel      5. Senile osteoporosis  M81.0 [facility] DEXA      6. Vitamin D deficiency  E55.9       7. Chronic seasonal allergic rhinitis due to pollen  J30.1       8. 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 Chronic dysfunction of right eustachian tube  H69.81       10. Irritable bowel syndrome with diarrhea  K58.0       11. Diverticulosis of large intestine without hemorrhage  K57.30       12. Breast cancer screening by mammogram  Z12.31 [facility] MAMMOGRAM BREAST SCREENING W/TOMO      13. Screening for depression  Z13.31       14. 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 Eddie Candle.  Next one due in 04/2022       Family History   Problem Relation Name Age of Onset    Hypertension Mother      Chronic Renal Disease Mother      Other (Prostate Cancer with bone mets) Father      No Known Problems Sister      No Known Problems Brother      No Known Problems Maternal Grandmother      No Known Problems Maternal Grandfather      No Known Problems Paternal Grandmother      No Known Problems Paternal Grandfather      Breast Cancer Other female cousin     No Known Problems Sister      No Known Problems Daughter      No Known Problems Daughter      No Known Problems Son         Social History     Socioeconomic History    Marital status: MARRIED     Spouse name: Bridgett, Hattabaugh    Number of children: 3  Years of education: Not on file    Highest education level: Not on file   Occupational History    Occupation: Retired   Tobacco Use    Smoking status: Never    Smokeless tobacco: Never   Vaping Use    Vaping Use: Never used   Substance and Sexual Activity    Alcohol use: Yes     Alcohol/week: 7.0 - 14.0 standard drinks     Types: 7 - 14 Glasses of wine per week    Drug use: No    Sexual activity: Yes     Partners: Male   Other Topics Concern    Military Service No    Blood Transfusions No    Caffeine Concern No    Occupational Exposure No    Hobby Hazards No    Sleep Concern No    Stress Concern No    Weight Concern Yes    Special Diet Yes    Back Care No    Exercise Yes    Bike Helmet Yes    Seat Belt Yes    Self-Exams Yes   Social History Narrative    BLANK     Social Determinants of Health     Financial Resource Strain: Low Risk     Difficulty of Paying Living Expenses: Not hard at all   Food Insecurity: Unknown    Worried About Programme researcher, broadcasting/film/video in the Last Year: Never true    Barista in the Last Year: Never true     Worried About Quality of Food: Not on file   Transportation Needs: No Transportation Needs    Lack of Transportation (Medical): No    Lack of Transportation (Non-Medical): No   Physical Activity: Sufficiently Active    Days of Exercise per Week: 4 days    Minutes of Exercise per Session: 60 min   Stress: No Stress Concern Present    Feeling of Stress : Only a little   Social Connections: Music therapist of Communication with Friends and Family: More than three times a week    Frequency of Social Gatherings with Friends and Family: More than three times a week    Attends Religious Services: More than 4 times per year    Active Member of [employer] or Organizations: Yes    Attends Engineer, structural: More than 4 times per year    Marital Status: Married   Catering manager Violence: Not At Risk    Fear of Current or Ex-Partner: No    Emotionally Abused: No    Physically Abused: No    Sexually Abused: No   Housing Stability: Not on file

## 2021-01-13 NOTE — Patient Instructions (Signed)
Preventative Healthcare Maintenance Review and Recommendations    Advanced Care Planning  End of Life Care decisions as well as the documents associated with such decisions are voluntary and not a requirement to receive medical care.  However, they are meant to protect you and the decision.  They are also for your family when you have reached that point in your life.    Vaccinations  - Flu vaccination:  Flu shots between pumpkins and turkeys.  - SARS-Cov2 vaccaintion:  Moderna - 07/25/2019; 08/22/2019; 05/20/2020; 09/21/2020  - Pneumococcal vaccination:     - PPSV23:  12/27/2018   - PCV13:  09/19/2017  - Td/TdaP vaccination:  TdaP - 08/11/2015  - Shingles Vaccination:  Zostavax - 08/11/2015.  I recommend Shingrix  - Hepatitis A/B Vaccination:  You may want to consider Twinrix vaccination    Routine Well-woman Care  - Last Mammogram:  07/31/2018.  Mammogram ordered.    Cardiovascular Health  An EKG was during the Welcome to [facility] Exam.  It revealed NSR.  Axises, wave intervals and wave morphology were all within expect parameters.    Pulmonary Health  Due to patient's smoking history, a Lung Cancer Screen (Low-dose Chest CT) is not appropriate.      Bone Health  - Last DEXA Scan:  05/12/2019  DEXA Scan due in 04/2021  - LaResults:  Osteoporosis with a T-Score to -2.5    GI Health  - Last Colon Cancer Screening via colonoscopy in 05/03/2012       - Monitoring schedule:  Next colon cancer screen due in 04/2022   - Hepatitis C Screen:  You may want to consider getting a Hep C screen    Ocular Health  I recommend routine visits with eye specialist to monitor for conditions such as glaucoma and cataracts.    Dental Health  I recommend routine dental visits for regular cleanings and check ups with X-Rays.    Dermatologic Health  I recommend regular skin checks - both personal (with aid of family members) and by a medical professional.  I also recommend skin protection - either through application of sun screen or  through clothing (hats, over garments) - when spending time out in the sun.

## 2021-02-25 ENCOUNTER — Other Ambulatory Visit (HOSPITAL_BASED_OUTPATIENT_CLINIC_OR_DEPARTMENT_OTHER): Payer: Self-pay | Admitting: Family Medicine

## 2021-02-25 DIAGNOSIS — Z23 Encounter for immunization: Secondary | ICD-10-CM

## 2021-04-26 ENCOUNTER — Ambulatory Visit (HOSPITAL_BASED_OUTPATIENT_CLINIC_OR_DEPARTMENT_OTHER): Admitting: Family Medicine

## 2021-05-11 ENCOUNTER — Ambulatory Visit
Admission: RE | Admit: 2021-05-11 | Discharge: 2021-05-11 | Disposition: A | Discharge status: Home / Self Care | Source: Ambulatory Visit | Attending: Family Medicine | Admitting: Family Medicine

## 2021-05-11 DIAGNOSIS — M810 Age-related osteoporosis without current pathological fracture: Secondary | ICD-10-CM | POA: Insufficient documentation

## 2021-05-11 DIAGNOSIS — Z1231 Encounter for screening mammogram for malignant neoplasm of breast: Secondary | ICD-10-CM | POA: Insufficient documentation

## 2021-06-28 ENCOUNTER — Ambulatory Visit: Attending: Family Medicine

## 2021-06-28 DIAGNOSIS — E785 Hyperlipidemia, unspecified: Secondary | ICD-10-CM

## 2021-06-28 LAB — COMPREHENSIVE METABOLIC PANEL
Adjusted Calcium: 8.7 mg/dL (ref 8.7–10.2)
Alanine Transferase (ALT): 25 U/L (ref 4–56)
Alb/Glob Ratio: 1.4 (ref 1.0–1.6)
Albumin: 4.1 g/dL (ref 3.2–4.7)
Alkaline Phosphatase (ALP): 57 U/L (ref 38–126)
Aspartate Transaminase (AST): 24 U/L (ref 9–44)
BUN/ Creatinine: 30.4 — ABNORMAL HIGH (ref 7.3–21.7)
Bilirubin Total: 1.2 mg/dL (ref 0.1–2.2)
Calcium: 8.8 mg/dL (ref 8.7–10.2)
Carbon Dioxide Total: 27 mmol/L (ref 22–32)
Chloride: 105 mmol/L (ref 99–109)
Creatinine Serum: 0.56 mg/dL (ref 0.50–1.30)
E-GFR, Non-African American: 116 mL/min/{1.73_m2} (ref >60)
E-GFR: 134 mL/min/{1.73_m2} (ref >60)
Globulin: 3 g/dL (ref 2.2–4.2)
Glucose: 97 mg/dL (ref 70–99)
Potassium: 3.7 mmol/L (ref 3.5–5.2)
Protein: 7.1 g/dL (ref 5.9–8.2)
Sodium: 137 mmol/L (ref 134–143)
Urea Nitrogen, Blood (BUN): 17 mg/dL (ref 6–21)

## 2021-06-28 LAB — LIPID PANEL
Cholesterol: 257 mg/dL — ABNORMAL HIGH (ref 112–200)
HDL Cholesterol: 90 mg/dL (ref >=40)
LDL Cholesterol Calculation: 150 mg/dL — ABNORMAL HIGH (ref <100)
Non-HDL Cholesterol: 167 mg/dL — ABNORMAL HIGH (ref <=150.0)
Total Cholesterol: HDL Ratio: 2.9 mg/dL (ref 2.0–5.0)
Triglyceride: 85 mg/dL (ref 30–150)

## 2021-07-01 ENCOUNTER — Ambulatory Visit (HOSPITAL_BASED_OUTPATIENT_CLINIC_OR_DEPARTMENT_OTHER): Admitting: Family Medicine

## 2021-07-01 ENCOUNTER — Encounter (HOSPITAL_BASED_OUTPATIENT_CLINIC_OR_DEPARTMENT_OTHER): Payer: Self-pay | Admitting: Family Medicine

## 2021-07-01 VITALS — BP 132/84 | HR 77 | Temp 98.5°F | Ht 63.0 in | Wt 159.2 lb

## 2021-07-01 DIAGNOSIS — K580 Irritable bowel syndrome with diarrhea: Secondary | ICD-10-CM

## 2021-07-01 DIAGNOSIS — M8589 Other specified disorders of bone density and structure, multiple sites: Secondary | ICD-10-CM | POA: Insufficient documentation

## 2021-07-01 DIAGNOSIS — E559 Vitamin D deficiency, unspecified: Secondary | ICD-10-CM

## 2021-07-01 DIAGNOSIS — M159 Polyosteoarthritis, unspecified: Secondary | ICD-10-CM

## 2021-07-01 DIAGNOSIS — Z0000 Encounter for general adult medical examination without abnormal findings: Secondary | ICD-10-CM

## 2021-07-01 DIAGNOSIS — G5702 Lesion of sciatic nerve, left lower limb: Secondary | ICD-10-CM

## 2021-07-01 DIAGNOSIS — E785 Hyperlipidemia, unspecified: Secondary | ICD-10-CM

## 2021-07-01 DIAGNOSIS — H6981 Other specified disorders of Eustachian tube, right ear: Secondary | ICD-10-CM

## 2021-07-01 NOTE — Progress Notes (Signed)
You have access to this medical note to help you improve your understanding of what was discussed during the office visit.  Medical notes are meant to be a communication tool between medical professionals and require medical terminology to be used for efficiency.      Furthermore, sections of this Progress Note may have been "cut and pasted" from Progress Notes written during previous appointments.  The information contained within these notes have been reviewed and changed as appropriate prior to the signing of this note.  Any information left unchanged was left intact as it was deemed still relevant during said review.    If you have questions about the meaning of the phrasing or medical terminology being used - or you have questions about the process for creating this note in-general - please bring it up at your next follow up appointment or send [place] a MyChart message with your questions.    HPI:  Rachel [name] is a 71 yr-old female who is here today for Routine HCM appointment with continued care and management of multiple medical conditions, including Dyslipidemia.    She reports pain in the left gluteal region with radiation down the left leg posteriorly.  We discussed probable etiologies and treatment options, including the risks and benefits of each option.  I recommended consideration for Physical Therapy.  This is something she does not want to consider at this time.  Home treatment, including stretching and heat application were also discussed.  Literature on stretching was provided.    Patient obtained labs prior to this appointment. We reviewed and discussed the results in detail.     While TCh:HDL ratio is within normal limits, the LDL level is elevated above 130 mg/dL  It is improved from 05/4994.    The results otherwise revealed no concerns or signficant abnormalities.  They indicate that various health conditions are stable.  Management of the etiologies for the abnormalities not directly  discussed here continues.  No changes are needed at this time.  Monitoring will continue.    We discussed the concerns, management options moving forward - from doing nothing to more aggressive options - as well as the risks and benefits of each option.      Ultimately, as she has not wanted to consider prescription medication and her Lipid Panel is not significantly concerning, I recommended making continued efforts to reduce the LDL, research supplements such as Red Rice Yeast Extract and consider a low-dose statin in the future [such as Rosuvastatin].    Throughout the discussion, she was given an opportunity to ask questions.  These were answered to her stated satisfaction.  She verbalized understanding.  She agreed with the plan of care.  Repeat labs were ordered (as a part of the [employer]).  We will continue this discussion then.    She does not have other immediate health concerns today.  We discussed other chronic health issues.  No further intervention is needed today as there are no changes that need to be made in management of the other chronic conditions.  She is otherwise doing well and denies any other concerns.     Finally, we also discussed and made preparations for a [facility] Annual Exam.  A routine set of labs was ordered.  They can be obtained prior to next appointment.  They will be reviewed and discuss then.  Review of the last Mammogram and DEXA were done as well.  These will not be due before the [facility]  Annual in 12/2021.    ROS:  A Review of Systems was performed. Pertinent details discussed in HPI. Otherwise, denies any issues or other physical concerns    Vitals:  BP 132/84 (SITE: left arm, Orthostatic Position: sitting, Cuff Size: regular)   Pulse 77   Temp 36.9 C (98.5 F) (Temporal)   Ht 1.6 m (5\' 3" )   Wt 72.2 kg (159 lb 3.2 oz)   SpO2 97%   BMI 28.20 kg/m     Physical Exam  Constitutional:       Appearance: She is well-developed.   HENT:      Head: Normocephalic  and atraumatic.   Eyes:      Conjunctiva/sclera: Conjunctivae normal.      Pupils: Pupils are equal, round, and reactive to light.   Cardiovascular:      Rate and Rhythm: Normal rate and regular rhythm.      Heart sounds: Normal heart sounds. No murmur heard.    No friction rub. No gallop.   Pulmonary:      Effort: Pulmonary effort is normal. No respiratory distress.      Breath sounds: Normal breath sounds. No wheezing or rales.   Abdominal:      General: Bowel sounds are normal. There is no distension.      Palpations: Abdomen is soft. There is no mass.      Tenderness: There is no abdominal tenderness.   Musculoskeletal:         General: Normal range of motion.      Cervical back: Normal range of motion.   Skin:     General: Skin is warm and dry.   Neurological:      Mental Status: She is alert and oriented to person, place, and time.   Psychiatric:         Behavior: Behavior normal.         Thought Content: Thought content normal.         Judgment: Judgment normal.     Assessment and Plan:    ICD-10-CM    1. Healthcare maintenance  Z00.00       2. Piriformis syndrome of left side  G57.02       3. 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 Chronic dysfunction of right eustachian tube  H69.81       5. Irritable bowel syndrome with diarrhea  K58.0       6. Dyslipidemia  E78.5 Comprehensive Metabolic Panel     Lipid Panel      7. Osteopenia of multiple sites  M85.89 Comprehensive Metabolic Panel     Vitamin D, 25 Hydroxy      8. Vitamin D deficiency  E55.9 Vitamin D, 25 Hydroxy        RTC in 12/2021 for a [facility] Annual Exam.

## 2021-10-11 ENCOUNTER — Encounter (HOSPITAL_BASED_OUTPATIENT_CLINIC_OR_DEPARTMENT_OTHER): Payer: Self-pay

## 2022-01-20 ENCOUNTER — Ambulatory Visit: Attending: Family Medicine

## 2022-01-20 DIAGNOSIS — E785 Hyperlipidemia, unspecified: Secondary | ICD-10-CM

## 2022-01-20 DIAGNOSIS — M8589 Other specified disorders of bone density and structure, multiple sites: Secondary | ICD-10-CM

## 2022-01-20 DIAGNOSIS — E559 Vitamin D deficiency, unspecified: Secondary | ICD-10-CM

## 2022-01-20 LAB — COMPREHENSIVE METABOLIC PANEL
Adjusted Calcium: 9.2 mg/dL (ref 8.7–10.2)
Alanine Transferase (ALT): 28 U/L (ref 4–56)
Alb/Glob Ratio: 1.3 (ref 1.0–1.6)
Albumin: 4 g/dL (ref 3.2–4.7)
Alkaline Phosphatase (ALP): 51 U/L (ref 38–126)
Aspartate Transaminase (AST): 25 U/L (ref 9–44)
BUN/ Creatinine: 32.8 — ABNORMAL HIGH (ref 7.3–21.7)
Bilirubin Total: 1.1 mg/dL (ref 0.1–2.2)
Calcium: 9.2 mg/dL (ref 8.7–10.2)
Carbon Dioxide Total: 26 mmol/L (ref 22–32)
Chloride: 107 mmol/L (ref 99–109)
Creatinine Serum: 0.61 mg/dL (ref 0.50–1.30)
E-GFR, Non-African American: 105 mL/min/{1.73_m2} (ref >60)
E-GFR: 121 mL/min/{1.73_m2} (ref >60)
Globulin: 3 g/dL (ref 2.2–4.2)
Glucose: 92 mg/dL (ref 70–99)
Potassium: 3.8 mmol/L (ref 3.5–5.2)
Protein: 7 g/dL (ref 5.9–8.2)
Sodium: 138 mmol/L (ref 134–143)
Urea Nitrogen, Blood (BUN): 20 mg/dL (ref 6–21)

## 2022-01-20 LAB — LIPID PANEL
Cholesterol: 253 mg/dL — ABNORMAL HIGH (ref 112–200)
HDL Cholesterol: 78 mg/dL (ref >=40)
LDL Cholesterol Calculation: 153 mg/dL — ABNORMAL HIGH (ref <100)
Non-HDL Cholesterol: 175 mg/dL — ABNORMAL HIGH (ref <=150.0)
Total Cholesterol: HDL Ratio: 3.2 mg/dL (ref 2.0–5.0)
Triglyceride: 109 mg/dL (ref 30–150)

## 2022-01-20 LAB — VITAMIN D, 25 HYDROXY: Vitamin D, 25 Hydroxy: 33.5 ng/mL (ref 30–80)

## 2022-01-24 ENCOUNTER — Ambulatory Visit (HOSPITAL_BASED_OUTPATIENT_CLINIC_OR_DEPARTMENT_OTHER): Admitting: Family Medicine

## 2022-01-24 ENCOUNTER — Encounter (HOSPITAL_BASED_OUTPATIENT_CLINIC_OR_DEPARTMENT_OTHER): Payer: Self-pay | Admitting: Family Medicine

## 2022-01-24 VITALS — BP 128/72 | HR 78 | Temp 97.9°F | Ht 63.0 in | Wt 159.1 lb

## 2022-01-24 DIAGNOSIS — Z7189 Other specified counseling: Secondary | ICD-10-CM

## 2022-01-24 DIAGNOSIS — Z0000 Encounter for general adult medical examination without abnormal findings: Secondary | ICD-10-CM

## 2022-01-24 DIAGNOSIS — Z7185 Encounter for immunization safety counseling: Secondary | ICD-10-CM

## 2022-01-24 DIAGNOSIS — E559 Vitamin D deficiency, unspecified: Secondary | ICD-10-CM

## 2022-01-24 DIAGNOSIS — M65331 Trigger finger, right middle finger: Secondary | ICD-10-CM

## 2022-01-24 DIAGNOSIS — Z1389 Encounter for screening for other disorder: Secondary | ICD-10-CM

## 2022-01-24 DIAGNOSIS — E785 Hyperlipidemia, unspecified: Secondary | ICD-10-CM

## 2022-01-24 DIAGNOSIS — K580 Irritable bowel syndrome with diarrhea: Secondary | ICD-10-CM

## 2022-01-24 DIAGNOSIS — M810 Age-related osteoporosis without current pathological fracture: Secondary | ICD-10-CM

## 2022-01-24 DIAGNOSIS — G5702 Lesion of sciatic nerve, left lower limb: Secondary | ICD-10-CM

## 2022-01-24 DIAGNOSIS — K5730 Diverticulosis of large intestine without perforation or abscess without bleeding: Secondary | ICD-10-CM

## 2022-01-24 DIAGNOSIS — Z1331 Encounter for screening for depression: Secondary | ICD-10-CM

## 2022-01-24 DIAGNOSIS — J301 Allergic rhinitis due to pollen: Secondary | ICD-10-CM

## 2022-01-24 DIAGNOSIS — H6981 Other specified disorders of Eustachian tube, right ear: Secondary | ICD-10-CM

## 2022-01-24 DIAGNOSIS — R922 Inconclusive mammogram: Secondary | ICD-10-CM

## 2022-01-24 DIAGNOSIS — M159 Polyosteoarthritis, unspecified: Secondary | ICD-10-CM

## 2022-01-24 NOTE — Patient Instructions (Signed)
Preventative Healthcare Maintenance Review and Recommendations    Advanced Care Planning  End of Life Care decisions as well as the documents associated with such decisions are voluntary and not a requirement to receive medical care.  However, they are meant to protect you and the decision.  They are also for your family when you have reached that point in your life.    I recommend reviewing your documents to make sure you agree with the information within them.    Vaccinations  - Flu vaccination:  Flu shots between pumpkins and turkeys.  - SARS-Cov2 vaccaintion:    - Moderna: 07/25/2019; 08/22/2019; 05/20/2020; 09/21/2020; 03/04/2021  - Pneumococcal vaccination:     - PPSV23:  12/27/2018   - PCV13:  09/19/2017  - Td/TdaP vaccination:    - TdaP:  08/11/2015  - Td:  This will be due in 07/2025  - History of chicken pox:  Unsure  - History of Shingles Outbreak:  No  - Shingles Vaccination:    - Zostavax:  08/11/2015.    - Shingrix:  You may want to consider this vaccination.  - Hepatitis A/B Vaccination:    - Twinrix:  You may want to consider this vaccination.    Cardiovascular Health  An EKG was during the Welcome to [facility] Exam.  It revealed NSR.  Axises, wave intervals and wave morphology were all within expect parameters.    Pulmonary Health  Due to patient's smoking history, a Lung Cancer Screen (Low-dose Chest CT) is not appropriate.      Routine Well-woman Care  - Last Mammogram:  05/11/2021.  Next one due in 04/2022    Bone Health  - Last DEXA Scan:  05/11/2021  Next one due in 04/2023  - Last Results:  Stable Osteoporosis with a T-Score to -2.6    GI Health  - Last Colon Cancer Screening via colonoscopy in 05/03/2012       - Monitoring schedule:  Next colon cancer screen due in 04/2022   - Hepatitis C Screen:  Refused    Ocular Health  I recommend routine visits with eye specialist to monitor for conditions such as glaucoma and cataracts.    Dental Health  I recommend routine dental visits for regular  cleanings and check ups with X-Rays.    Dermatologic Health  I recommend regular skin checks - both personal (with aid of family members) and by a medical professional.  I also recommend skin protection - either through application of sun screen or through clothing (hats, over garments) - when spending time out in the sun.

## 2022-01-24 NOTE — Progress Notes (Signed)
You have access to this medical note to help you improve your understanding of what was discussed during the office visit.  Medical notes are meant to be a communication tool between medical professionals and require medical terminology to be used for efficiency.      Furthermore, sections of this Progress Note may have been "cut and pasted" from Progress Notes written during previous appointments.  The information contained within these notes have been reviewed and changed as appropriate prior to the signing of this note.  Any information left unchanged was left intact as it was deemed still relevant during said review.    If you have questions about the meaning of the phrasing or medical terminology being used - or you have questions about the process for creating this note in-general - please bring it up at your next follow up appointment or send [place] a MyChart message with your questions.     HPI:  Rachel [name] is a 71 yr-old female who is here today for a Welcome to [facility] Exam.    We reviewed PMH/PSH, family and social histories.  I obtained further information when needed and entered it into the EMR chart.    She has no healthcare needs or issues that need to be immediately addressed.  We discussed other chronic health issues.    We reviewed and discussed the results of labs most recently obtained prior to this appointment as well as results from previous labs for comparison and trending.  While there are abnormalities noted, the results otherwise revealed no new concerns or signficant changes to those abnormalities.  They indicate that various health conditions are stable and bring up no concerns.  Management of the etiologies for these abnormalities continues and does not need to change at this time.  Monitoring will continue.     She is otherwise doing well and denies any other concerns.  No further intervention is needed today as there are no changes that need to be made in management of these  conditions.     ROS:  A Review of Systems performed. Pertinent details discussed in HPI. Otherwise, denies any issues or other physical concerns    Vital Signs:  BP 128/72 (SITE: left arm, Orthostatic Position: sitting, Cuff Size: regular)   Pulse 78   Temp 36.6 C (97.9 F) (Temporal)   Ht 1.6 m (5\' 3" )   Wt 72.2 kg (159 lb 1.6 oz)   SpO2 94%   BMI 28.18 kg/m      Physical Exam  Constitutional:       Appearance: She is well-developed.   HENT:      Head: Normocephalic and atraumatic.   Eyes:      Conjunctiva/sclera: Conjunctivae normal.      Pupils: Pupils are equal, round, and reactive to light.   Cardiovascular:      Rate and Rhythm: Normal rate and regular rhythm.      Heart sounds: Normal heart sounds. No murmur heard.     No friction rub. No gallop.   Pulmonary:      Effort: Pulmonary effort is normal. No respiratory distress.      Breath sounds: Normal breath sounds. No wheezing or rales.   Abdominal:      General: Bowel sounds are normal. There is no distension.      Palpations: Abdomen is soft. There is no mass.      Tenderness: There is no abdominal tenderness.   Musculoskeletal:  General: Normal range of motion.      Cervical back: Normal range of motion.   Skin:     General: Skin is warm and dry.   Neurological:      Mental Status: She is alert and oriented to person, place, and time.   Psychiatric:         Behavior: Behavior normal.         Thought Content: Thought content normal.         Judgment: Judgment normal.     Preventative Healthcare     Preventative Questionnaires  Medicare Questionnaire (Health Risk Assessment) -Scanned into patient's medical record.    All questionnaires were reviewed with the patient and the appropriate counseling was discussed.     Reviewed allergies, past medical history, past surgical history, family history, social history, and current medications and supplements.     Advanced Care Planning  The patient and I reviewed the Advance Directive / POLST. The  documents in EMR are up to date.  Advanced Directives are dated 03/27/2005.  POLST Form is dated 08/08/2020.  I assured the patient that such planning is voluntary and not a requirement to receive medical care.    Vaccinations  - Flu vaccination:  Sporadically.  Last one on record - 02/23/2020  - SARS-Cov2 vaccaintion:    - Moderna: 07/25/2019; 08/22/2019; 05/20/2020; 09/21/2020; 03/04/2021  - Pneumococcal vaccination:     - PPSV23:  12/27/2018   - PCV13:  09/19/2017  - Td/TdaP vaccination:    - TdaP:  08/11/2015  - History of chicken pox:  Unsure  - History of Shingles Outbreak:  No  - Shingles Vaccination:    - Zostavax:  08/11/2015.    - Shingrix:  Discussed  - Hepatitis A/B Vaccination:    - Twinrix:  Discussed     Cardiovascular Health  An EKG was during the Welcome to [facility] Exam.  It revealed NSR.  Axises, wave intervals and wave morphology were all within expect parameters.    Pulmonary Health  Due to patient's smoking history, a Lung Cancer Screen (Low-dose Chest CT) is not appropriate.      Routine [facility] provided by Cassell Clement.  - LMP:  ~2011  - Last Pap/pelvic:  04/08/2015.  Recommended returning to Gyn       - h/o abnormal exam:  No  - Last Mammogram:  02/23/2020.  Mammogram ordered.       - h/o abnormal mammograms:  No    Bone Health  - Last DEXA Scan:  05/11/2021    - Last Results:  Stable Osteoporosis with a T-Score to -2.6    GI Health  - Last Colon Cancer Screening via colonoscopy in 05/03/2012       - Monitoring schedule:  Next colon cancer screen due in 04/2022   - Hepatitis C Screen:  Refused    Ocular Health  - Routine visits with eye specialist:  Annual exams with glaucoma screens  We discussed the importance of routine eye care to monitor for conditions such as glaucoma and cataracts.    Dental Health   - Routine dental visits:  Semiannual cleanings.  Annual check ups with X-Rays   We discussed the importance for continued dental care.    Dermatologic  Health  - Routine visits to a Dermatologist:  Established with Dr. No.  Routine skin checks performed.  We discussed the importance of regular skin checks - both personal (with aid of family members) and  by a medical professional.  We also discussed the need for skin protection from the sun.    Miscellaneous Preventative Healthcare Maintenance Items    The patient is negative for cognitive impairment.     Opioid Risk Screening: N/A    Substance Use Screening: N/A    Hearing/Fall Prevention: Discussed with patient. See orders as appropriate.    Stop Smoking Counseling: n/a    Reduce Alcohol Intake Counseling:yes    Exercise and Nutrition Counseling: Discussed with patient and handouts provided.    A written updated summary of Medicare Annual Wellness Visit was given to this patient. It details what healthcare maintenance has been done and when they are due for their next subsequent Medicare Annual Wellness Visit.    Assessment and Plan:    ICD-10-CM    1. Encounter for Medicare annual wellness exam  Z00.00       2. Counseling regarding end of life decision making  Z71.89       3. Immunization counseling  Z71.85       4. Piriformis syndrome of left side  G57.02       5. 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 Chronic dysfunction of right eustachian tube  H69.81       7. Dyslipidemia  E78.5 Comprehensive Metabolic Panel     Lipid Panel      8. Senile osteoporosis  M81.0 Comprehensive Metabolic Panel      9. Vitamin D deficiency  E55.9 Comprehensive Metabolic Panel     Vitamin D, 25 Hydroxy      10. Trigger middle finger of right hand  M65.331       11. Irritable bowel syndrome with diarrhea  K58.0       12. Diverticulosis of large intestine without hemorrhage  K57.30       13. Chronic seasonal allergic rhinitis due to pollen  J30.1       14. Dense breasts  R92.2       15. Screening for depression  Z13.31       16. Screening for substance abuse  Z13.89         RTC 1 Year for  [facility] Annual Exam.    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 Eddie Candle.  Next one due in 04/2022       Family History   Problem Relation Name Age of Onset    Hypertension Mother      Chronic Renal Disease Mother      Other (Prostate Cancer with bone mets) Father      No Known Problems Sister      No Known Problems Brother      No Known Problems Maternal Grandmother      No Known Problems Maternal Grandfather      No Known Problems Paternal Grandmother  No Known Problems Paternal Grandfather      Breast Cancer Other female cousin     No Known Problems Sister      No Known Problems Daughter      No Known Problems Daughter      No Known Problems Son         Social History     Socioeconomic History    Marital status: MARRIED     Spouse name: Shirlena, Brinegar    Number of children: 3    Years of education: Not on file    Highest education level: Not on file   Occupational History    Occupation: Retired   Tobacco Use    Smoking status: Never    Smokeless tobacco: Never   Vaping Use    Vaping Use: Never used   Substance and Sexual Activity    Alcohol use: Yes     Alcohol/week: 7.0 - 14.0 standard drinks of alcohol     Types: 7 - 14 Glasses of wine per week    Drug use: No    Sexual activity: Yes     Partners: Male   Other Topics Concern    Military Service No    Blood Transfusions No    Caffeine Concern No    Occupational Exposure No    Hobby Hazards No    Sleep Concern No    Stress Concern No    Weight Concern Yes    Special Diet Yes    Back Care  No    Exercise Yes    Bike Helmet Yes    Seat Belt Yes    Self-Exams Yes   Social History Narrative    BLANK     Social Determinants of Health     Financial Resource Strain: Low Risk  (01/13/2021)    Overall Financial Resource Strain (CARDIA)     Difficulty of Paying Living Expenses: Not hard at all   Food Insecurity: No Food Insecurity (01/13/2021)    Food Insecurity     Worried About Radiation protection practitioner of Food in the Last Year: Never true     Ran Out of Food in the Last Year: Never true     Worried About Quality of Food: No   Transportation Needs: No Transportation Needs (01/13/2021)    PRAPARE - Therapist, art (Medical): No     Lack of Transportation (Non-Medical): No   Physical Activity: Sufficiently Active (01/13/2021)    Exercise Vital Sign     Days of Exercise per Week: 5 days     Minutes of Exercise per Session: 60 min   Stress: No Stress Concern Present (01/13/2021)    [employer] of Occupational Health - Occupational Stress Questionnaire     Feeling of Stress : Only a little   Social Connections: Socially Integrated (01/13/2021)    Social Connection and Isolation Panel [NHANES]     Frequency of Communication with Friends and Family: More than three times a week     Frequency of Social Gatherings with Friends and Family: More than three times a week     Attends Religious Services: More than 4 times per year     Active Member of [employer] or Organizations: Yes     Attends Banker Meetings: More than 4 times per year     Marital Status: Married   Catering manager Violence: Not At Risk (01/13/2021)    Humiliation, Afraid, Rape, and Kick questionnaire  Fear of Current or Ex-Partner: No     Emotionally Abused: No     Physically Abused: No     Sexually Abused: No   Housing Stability: Unknown (01/13/2021)    Housing Stability Vital Sign     Unable to Pay for Housing in the Last Year: No     Number of Places Lived in the Last Year: Not on file     Unstable Housing in the Last Year: No

## 2022-03-03 ENCOUNTER — Other Ambulatory Visit (HOSPITAL_BASED_OUTPATIENT_CLINIC_OR_DEPARTMENT_OTHER): Payer: Self-pay | Admitting: Family Medicine

## 2022-03-03 DIAGNOSIS — Z23 Encounter for immunization: Secondary | ICD-10-CM

## 2022-03-29 ENCOUNTER — Telehealth (HOSPITAL_BASED_OUTPATIENT_CLINIC_OR_DEPARTMENT_OTHER): Payer: Self-pay | Admitting: Family Medicine

## 2022-03-29 ENCOUNTER — Encounter (HOSPITAL_BASED_OUTPATIENT_CLINIC_OR_DEPARTMENT_OTHER): Payer: Self-pay | Admitting: Family

## 2022-03-29 ENCOUNTER — Ambulatory Visit
Admission: RE | Admit: 2022-03-29 | Discharge: 2022-03-29 | Disposition: A | Discharge status: Home / Self Care | Source: Ambulatory Visit | Attending: Family | Admitting: Family

## 2022-03-29 ENCOUNTER — Ambulatory Visit (HOSPITAL_BASED_OUTPATIENT_CLINIC_OR_DEPARTMENT_OTHER): Admitting: Family

## 2022-03-29 VITALS — BP 142/78 | HR 89 | Temp 97.5°F | Ht 63.0 in | Wt 161.3 lb

## 2022-03-29 NOTE — Progress Notes (Signed)
Family Practice Clinic Visit     Date: 03/29/2022  Patient Name: Rachel [name]  DOB: 1951-01-04      CHIEF COMPLAINT:   Chief Complaint   Patient presents with    Knee Pain        HISTORY OF PRESENT ILLNESS:  Rachel [name] is a pleasant 71 year old female who presents for worsening bilateral knee pain, right worse than left, ongoing for a few years. She had a recent flair of her right knee pain and swelling, worse to the lateral side. She hears intermittently clicking and popping to her bilateral knees She was gardening and playing pickle ball in September which exacerbated her pain. She has been icing the area and using tumeric with mild relief. She has a history of osteoporosis and takes calcium daily. She has intermittent RUQ abdominal pain for the past few weeks. Denies any exacerbation of pain with certain foods. Denies history of cholecystectomy.    No other concerns or complaints reported.    We discussed her symptoms are likely due to arthritis. I advised undergoing XR bilateral knees with evaluate her arthritis. I advised taking motrin or aleve, using topical arthritis cream, and using tumeric or CBD for pain relief. I advised referral to Orthopedics for evaluation. We discussed her symptoms are likely due to her gallbladder or pancreas. I advised undergoing a lipase panel to evaluate.         Current Outpatient Medications on File Prior to Visit   Medication    CALCIUM CARBONATE (CALCIUM 500 PO)    diphenhydramine HCl (ANTIHISTAMINE PO)    FLAX SEED OIL 1,000 mg Capsule    Fluticasone (FLONASE ALLERGY RELIEF) 50 mcg/actuation nasal spray    IBUPROFEN PO    LACTOBACILLUS ACIDOPHILUS (PROBIOTIC PO)    MULTIVITAMIN PO    TUMERIC-GING-OLIVE-OREG-CAPRYL PO    ValACYclovir (VALTREX) 1 gram tablet     No current facility-administered medications on file prior to visit.     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 Eddie Candle.  Next one due in 04/2022     Social History     Socioeconomic History    Marital status: MARRIED     Spouse name: Lakenzie, Mcclafferty    Number of children: 3    Years of education: Not on file    Highest education level: Not on file   Occupational History    Occupation: Retired   Tobacco Use    Smoking status: Never    Smokeless tobacco: Never   Vaping Use    Vaping Use: Never used   Substance and Sexual Activity    Alcohol use: Yes     Alcohol/week: 7.0 - 14.0 standard drinks of alcohol     Types: 7 - 14 Glasses of wine per week    Drug use: No    Sexual activity: Yes     Partners: Male  Family History   Problem Relation Name Age of Onset    Hypertension Mother      Chronic Renal Disease Mother      Other (Prostate Cancer with bone mets) Father      No Known Problems Sister      No Known Problems Brother      No Known Problems Maternal Grandmother      No Known Problems Maternal Grandfather      No Known Problems Paternal Grandmother      No Known Problems Paternal Grandfather      Breast Cancer Other female cousin     No Known Problems Sister      No Known Problems Daughter      No Known Problems Daughter      No Known Problems Son         Review of Systems   Constitutional:  Negative for chills, fatigue and fever.   Eyes:  Negative for pain and discharge.   Respiratory:  Negative for cough and shortness of breath.    Gastrointestinal:  Positive for abdominal pain. Negative for diarrhea, nausea and vomiting.   Musculoskeletal:  Positive for arthralgias, joint swelling and myalgias.   Skin:  Negative for rash.   Neurological:   Negative for dizziness, weakness, numbness and headaches.       Vitals: BP (!) 142/78 (SITE: left arm, Orthostatic Position: sitting, Cuff Size: regular)   Pulse 89   Temp 36.4 C (97.5 F) (Temporal)   Ht 1.6 m (5\' 3" )   Wt 73.2 kg (161 lb 4.8 oz)   SpO2 97%   BMI 28.57 kg/m   No LMP recorded. Patient is postmenopausal.    Physical Exam  Vitals and nursing note reviewed.   Constitutional:       Appearance: Normal appearance.   HENT:      Head: Normocephalic and atraumatic.   Eyes:      Conjunctiva/sclera: Conjunctivae normal.   Cardiovascular:      Rate and Rhythm: Normal rate.   Pulmonary:      Effort: Pulmonary effort is normal.   Abdominal:      General: Abdomen is flat. Bowel sounds are normal. There is no distension.      Palpations: Abdomen is soft. There is no mass.      Tenderness: There is no abdominal tenderness. There is no guarding or rebound.      Hernia: No hernia is present.   Musculoskeletal:         General: Normal range of motion.      Cervical back: Normal range of motion and neck supple.      Right knee: Swelling and effusion present. No erythema, ecchymosis, bony tenderness or crepitus. Normal range of motion. Tenderness present over the lateral joint line. No medial joint line, MCL, LCL, ACL, PCL or patellar tendon tenderness. No LCL laxity, MCL laxity, ACL laxity or PCL laxity. Normal alignment, normal meniscus and normal patellar mobility.      Left knee: Crepitus present. No swelling, effusion, erythema, ecchymosis or bony tenderness. Normal range of motion. No tenderness. No medial joint line, lateral joint line, MCL, LCL, ACL, PCL or patellar tendon tenderness. No LCL laxity, MCL laxity, ACL laxity or PCL laxity.Normal alignment, normal meniscus and normal patellar mobility.   Skin:     General: Skin is warm and dry.   Neurological:      General: No focal deficit present.      Mental Status: She is  alert and oriented to person, place, and time. Mental status is at baseline.    Psychiatric:         Mood and Affect: Mood normal.            LAB RESULTS:  Lab Visit on 01/20/2022   Component Date Value Ref Range Status    Sodium 01/20/2022 138  134 - 143 mmol/L Final    Potassium 01/20/2022 3.8  3.5 - 5.2 mmol/L Final    Chloride 01/20/2022 107  99 - 109 mmol/L Final    Carbon Dioxide Total 01/20/2022 26  22 - 32 mmol/L Final    Urea Nitrogen, Blood (BUN) 01/20/2022 20  6 - 21 mg/dL Final    Creatinine Serum 01/20/2022 0.61  0.50 - 1.30 mg/dL Final    BUN/ Creatinine 01/20/2022 32.8 (H)  7.3 - 21.7 Final    Glucose 01/20/2022 92  70 - 99 mg/dL Final    Calcium 64/33/2951 9.2  8.7 - 10.2 mg/dL Final    Adjusted Calcium 01/20/2022 9.2  8.7 - 10.2 mg/dL Final    Protein 88/41/6606 7.0  5.9 - 8.2 g/dL Final    Albumin 30/16/0109 4.0  3.2 - 4.7 g/dL Final    Alkaline Phosphatase (ALP) 01/20/2022 51  38 - 126 U/L Final    Aspartate Transaminase (AST) 01/20/2022 25  9 - 44 U/L Final    Bilirubin Total 01/20/2022 1.1  0.1 - 2.2 mg/dL Final    Alanine Transferase (ALT) 01/20/2022 28  4 - 56 U/L Final    Globulin 01/20/2022 3.0  2.2 - 4.2 g/dL Final    Alb/Glob Ratio 01/20/2022 1.3  1.0 - 1.6 Final    E-GFR 01/20/2022 121  >60 mL/min/1.20m*2 Final    E-GFR, Non-African American 01/20/2022 105  >60 mL/min/1.26m*2 Final    Lipid Fasting MMC 01/20/2022 Yes   Final    Cholesterol 01/20/2022 253 (H)  112 - 200 mg/dL Final    Triglyceride 32/35/5732 109  30 - 150 mg/dL Final    HDL Cholesterol 01/20/2022 78  >=40 mg/dL Final    LDL Cholesterol Calculation 01/20/2022 153 (H)  <100 mg/dL Final    Non-HDL Cholesterol 01/20/2022 175.0 (H)  <=150.0 mg/dL Final    Total Cholesterol: HDL Ratio 01/20/2022 3.2  2.0 - 5.0 mg/dL Final    Vitamin D, 25 Hydroxy 01/20/2022 33.5  30 - 80 ng/mL Final     Any [facility] Lab results are scanned in the Media folder.    DIAGNOSTIC IMAGING RESULTS in EPIC:   [facility] MAMMOGRAM BREAST SCREENING W/TOMO    Result Date: 05/11/2021  EXAMINATION: Screening bilateral digital breast  tomosynthesis with CAD INDICATION: Screening. COMPARISON: February 23, 2020 and July 31, 2018 FINDINGS: Screening bilateral digital breast tomosynthesis was performed in the CC and MLO projections. Bilateral 2-D synthesized images were constructed in the CC and MLO projections. ACR breast density B: Scattered fibroglandular density. There are no suspicious masses, suspicious groups of microcalcification or areas of architectural distortion. CAD markers are considered to be noncontributory.     IMPRESSION: 1. No mammographic evidence of malignancy and no significant interval change from comparison 636. 2. Lack of mammographic findings should neither delay nor deter biopsy of a clinically suspicious palpable finding. BI-RADS CATEGORY 1: NEGATIVE. RECOMMENDATION: The [employer] of Radiology and the Society of Breast Imaging recommend annual mammogram for most women beginning at age 71, and continuing as long as a woman is in good health. However breast imaging frequency may vary depending  on an individual risk factors. Please consult your primary care physician to discuss your level of risk and optimal management of your breast care.         ASSESSMENT AND PLAN:  1. Acute pain of both knees  - Discussed her symptoms are likely due to arthritis.   - Advised undergoing XR bilateral knees with evaluate her arthritis.   - Advised taking motrin or aleve, using topical arthritis cream, and using tumeric or CBD for pain relief.   - Advised referral to Orthopedics for evaluation.  - MMC KNEE BILATERAL COMPLETE; Future  - ORTHOPEDIC-JOINT REFERRAL    2. 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   - Advised undergoing XR bilateral knees with evaluate her arthritis.   - Advised taking motrin or aleve, using topical arthritis cream, and using tumeric or CBD for pain relief.   - Advised referral to Orthopedics for evaluation.  - MMC KNEE BILATERAL  COMPLETE; Future  - ORTHOPEDIC-JOINT REFERRAL    3. Abdominal pain, right upper quadrant  - Advised lipase panel to evaluate her RUQ pain  - Lipase; Future         FOLLOW-UP:  Return if symptoms worsen or fail to improve.    I, Erven Colla, SCRIBE, am scribing through remote communication methods for Gari Crown, FNP on 03/29/22 at 11:15 AM.        Dr. Gari Crown, DNP, FNP-BC

## 2022-03-29 NOTE — Telephone Encounter (Signed)
Patient returned call - I tried to get Malva Cogan on the phone, but was unable to reach her. Patient is scheduled for OV with Wylene Men on 11/13 and wants to do the steroid injection in her right knee on that day.    Please advise, thank you.

## 2022-04-03 ENCOUNTER — Encounter (HOSPITAL_BASED_OUTPATIENT_CLINIC_OR_DEPARTMENT_OTHER): Payer: Self-pay | Admitting: Family

## 2022-04-03 ENCOUNTER — Telehealth: Payer: Self-pay | Admitting: Family Medicine

## 2022-04-03 ENCOUNTER — Ambulatory Visit (HOSPITAL_BASED_OUTPATIENT_CLINIC_OR_DEPARTMENT_OTHER): Admitting: Family

## 2022-04-03 DIAGNOSIS — Z1211 Encounter for screening for malignant neoplasm of colon: Secondary | ICD-10-CM

## 2022-04-03 MED ORDER — BUPIVACAINE (PF) 0.25 % (2.5 MG/ML) INJECTION SOLUTION
5.0000 mL | Freq: Once | INTRAMUSCULAR | Status: AC
Start: 2022-04-03 — End: 2022-04-03
  Administered 2022-04-03: 5 mL

## 2022-04-03 MED ORDER — TRIAMCINOLONE ACETONIDE 40 MG/ML SUSPENSION FOR INJECTION
40.0000 mg | Freq: Once | INTRAMUSCULAR | Status: AC
Start: 2022-04-03 — End: 2022-04-03
  Administered 2022-04-03: 40 mg via INTRASYNOVIAL

## 2022-04-03 NOTE — Progress Notes (Signed)
Family Practice Clinic Visit     Date: 04/03/2022  Patient Name: Rachel [name]  DOB: November 13, 1950      CHIEF COMPLAINT:   Chief Complaint   Patient presents with    Injection     RIGHT KNEE STEROID         HISTORY OF PRESENT ILLNESS:  Rachel [name] is a pleasant 71 year old female who presents for a right knee steroid injection. She underwent a b/l knee XR on 03/29/2022 which showed mild to moderate right knee osteoarthritis showing patellofemoral compartment and medial compartment predominance. Mild left knee osteoarthritis showing medial compartment prominence. Subjectively decreased bone mineralization without acute fracture at either knee. Small bilateral knee joint effusions. She has recurrent right knee pain and swelling. She is requesting a right knee steroid injection today. She plans to follow up with Orthopedics.    No other concerns or complaints reported.    I performed a right knee steroid injection without complications. I discussed post-injection care and follow up precautions.         Current Outpatient Medications on File Prior to Visit   Medication    CALCIUM CARBONATE (CALCIUM 500 PO)    diphenhydramine HCl (ANTIHISTAMINE PO)    FLAX SEED OIL 1,000 mg Capsule    Fluticasone (FLONASE ALLERGY RELIEF) 50 mcg/actuation nasal spray    IBUPROFEN PO    LACTOBACILLUS ACIDOPHILUS (PROBIOTIC PO)    MULTIVITAMIN PO    TUMERIC-GING-OLIVE-OREG-CAPRYL PO    ValACYclovir (VALTREX) 1 gram tablet     No current facility-administered medications on file prior to visit.     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 Maisie Fus.  Next one due in 04/2022     Social History     Socioeconomic History    Marital status: MARRIED     Spouse name: Vicki, Chaffin    Number of children: 3    Years of education: Not on file    Highest education level: Not on file   Occupational History    Occupation: Retired   Tobacco Use    Smoking status: Never    Smokeless tobacco: Never   Vaping Use    Vaping Use: Never used   Substance and Sexual Activity    Alcohol use: Yes     Alcohol/week: 7.0 - 14.0 standard drinks of alcohol     Types: 7 - 14 Glasses of wine per week    Drug use: No    Sexual activity: Yes     Partners: Male     Family History   Problem Relation Name Age of Onset    Hypertension Mother      Chronic Renal Disease Mother      Other (Prostate Cancer with bone mets) Father      No Known Problems Sister      No Known Problems Brother      No Known  Problems Maternal Grandmother      No Known Problems Maternal Grandfather      No Known Problems Paternal Grandmother      No Known Problems Paternal Grandfather      Breast Cancer Other female cousin     No Known Problems Sister      No Known Problems Daughter      No Known Problems Daughter      No Known Problems Son         Review of Systems   Constitutional:  Negative for chills, fatigue and fever.   Eyes:  Negative for pain and discharge.   Respiratory:  Negative for cough and shortness of breath.    Gastrointestinal:  Negative for abdominal pain, diarrhea, nausea and vomiting.   Musculoskeletal:  Positive for joint swelling and myalgias.   Skin:  Negative for rash.   Neurological:  Negative for dizziness and headaches.       Vitals: Pulse (P) 64   Temp (P) 36.3 C (97.4 F) (Temporal)   Resp (P) 16   Ht (P) 1.6 m (_0 )   Wt (P) 73.3 kg (161 lb 9.6 oz)   SpO2 (P) 98%   BMI (P) 28.63 kg/m   No LMP recorded. Patient is postmenopausal.    Physical Exam  Vitals and nursing note reviewed.   Constitutional:        Appearance: Normal appearance.   HENT:      Head: Normocephalic and atraumatic.   Eyes:      Conjunctiva/sclera: Conjunctivae normal.   Cardiovascular:      Rate and Rhythm: Normal rate.   Pulmonary:      Effort: Pulmonary effort is normal.   Musculoskeletal:         General: Normal range of motion.      Cervical back: Normal range of motion and neck supple.   Skin:     General: Skin is warm and dry.   Neurological:      General: No focal deficit present.      Mental Status: She is alert and oriented to person, place, and time. Mental status is at baseline.   Psychiatric:         Mood and Affect: Mood normal.            LAB RESULTS:  Lab Visit on 01/20/2022   Component Date Value Ref Range Status    Sodium 01/20/2022 138  134 - 143 mmol/L Final    Potassium 01/20/2022 3.8  3.5 - 5.2 mmol/L Final    Chloride 01/20/2022 107  99 - 109 mmol/L Final    Carbon Dioxide Total 01/20/2022 26  22 - 32 mmol/L Final    Urea Nitrogen, Blood (BUN) 01/20/2022 20  6 - 21 mg/dL Final    Creatinine Serum 01/20/2022 0.61  0.50 - 1.30 mg/dL Final    BUN/ Creatinine 01/20/2022 32.8 (H)  7.3 - 21.7 Final    Glucose 01/20/2022 92  70 - 99 mg/dL Final    Calcium 01/20/2022 9.2  8.7 - 10.2 mg/dL Final    Adjusted Calcium 01/20/2022 9.2  8.7 - 10.2 mg/dL Final    Protein 01/20/2022 7.0  5.9 - 8.2 g/dL Final    Albumin 01/20/2022 4.0  3.2 - 4.7 g/dL Final    Alkaline Phosphatase (ALP) 01/20/2022 51  38 - 126 U/L Final    Aspartate Transaminase (AST) 01/20/2022 25  9 - 44 U/L Final    Bilirubin Total 01/20/2022 1.1  0.1 - 2.2 mg/dL Final    Alanine Transferase (ALT) 01/20/2022 28  4 - 56 U/L Final    Globulin 01/20/2022 3.0  2.2 - 4.2 g/dL Final    Alb/Glob Ratio 01/20/2022 1.3  1.0 - 1.6 Final    E-GFR 01/20/2022 121  >60 mL/min/1.24m2 Final    E-GFR, Non-African American 01/20/2022 105  >60 mL/min/1.738m Final    Lipid Fasting [facility] 01/20/2022 Yes   Final    Cholesterol 01/20/2022 253 (H)  112 - 200 mg/dL Final    Triglyceride 01/20/2022 109   30 - 150 mg/dL Final    HDL Cholesterol 01/20/2022 78  >=40 mg/dL Final    LDL Cholesterol Calculation 01/20/2022 153 (H)  <100 mg/dL Final    Non-HDL Cholesterol 01/20/2022 175.0 (H)  <=150.0 mg/dL Final    Total Cholesterol: HDL Ratio 01/20/2022 3.2  2.0 - 5.0 mg/dL Final    Vitamin D, 25 Hydroxy 01/20/2022 33.5  30 - 80 ng/mL Final     Any [facility] Lab results are scanned in the Media folder.    DIAGNOSTIC IMAGING RESULTS in EPIC:   [facility]    Result Date: 03/29/2022  KNEE BILATERAL COMPLETE: TECHNIQUE: AP, AP oblique, lateral, tunnel and sunrise views of right and left knees. INDICATION: Bilateral knee pain. COMPARISON: Left knee radiographs dated 04/06/2004. FINDINGS: Right knee: There is subjectively decreased bone mineralization without evidence for acute fracture. There is slight lateral maltracking of the patella reactive to the femoral trochlea. Mild to moderate tricompartmental right knee osteoarthritic changes are noted, evidenced by joint space narrowing, subchondral sclerosis and small marginal osteophytes, and showing predominance at the lateral facet of the patellofemoral joint and at the medial compartment. Small joint effusion is present. Soft tissue planes at the knee are preserved. Left knee: There is subjectively decreased bone mineralization without evidence of acute left knee fracture. Patellar tracking is neutral. Small knee joint effusion is present. There is mild tricompartmental left knee osteoarthritis showing medial compartment predominance. Soft tissue planes are preserved.     IMPRESSION: 1. Mild to moderate right knee osteoarthritis showing patellofemoral compartment and medial compartment predominance. 2. Mild left knee osteoarthritis showing medial compartment prominence. 3. Subjectively decreased bone mineralization without acute fracture at either knee. 4. Small bilateral knee joint effusions.      PROCEDURE: Steroid injection of the right knee  Consent:   We  discussed the treatment options available today, as well as the risks and benefits of each option.  For this specific procedure, I explained the risks of pain, infection, bruising, repeat procedure, unknown duration of effect, and no guarantees of relief.    After the discussion, the patient verbalized understanding as well as wishes to proceed forward with this recommended procedure. The patient signed an informed consent form. A witness and I both countersigned it. It was scanned into the EMR Chart.    Laterality:  right    Anesthesia:  1cc 0.25% Bupivacaine injected along tract    Medication used:   36m8mf 24m24mL Kenalog mixed with 4mL 43m0.25% Bupivacaine      Procedure:    The patient was asked to sit in the procedure chair, with the knee flexed to ~75-80 degrees.    The site of injection was identified through palpation of the patellar tendon (inferior to the patella) and locating the "dimple" ~1 cm lateral to that tendon.  To mark the site, an indentation was made in the skin with the hollow point of the  ballpoint pen.    The site was then prepped with iodine swab, followed by an alcohol swab. Anesthesia was achieved through injection of Lidocaine along the tract, using a 1cc syringe with 27 gauge needle.    A 5cc syringe, filled with 4cc 0.25% Bupivacaine and 1cc of Kenalog 4omg/6m was fitted with an 18-gauge, 1-inch needle. The anesthetic was then given time to take effect before proceeding.     The 18-gauge needle was inserted at the marked injection site.  The needle was inserted obliquely (approximately 15-20 degrees from midline) into the joint.  If any periosteum was met with the needle tip, the needle was redirected until the joint space was entered.     After negative aspiration, the fluid was injected, per usual technique, into the joint space.  The needle was then removed.       Post-Procedure:  The patient tolerated the procedure well with no evidence of immediate complications.    The patient  reported immediate improvement in pain and was able to ambulate without issue.    Mild bleeding was controlled with direct pressure.  The site was cleansed again with an alcohol pad and dressed with an elastic bandage.      ASSESSMENT AND PLAN:  1. Tricompartment osteoarthritis of right knee  - Reviewed b/l knee XR results with patient.  - Underwent right knee steroid injection today without complications.         FOLLOW-UP:  Return in about 4 weeks (around 05/01/2022) for right knee pain.    I, HBarkley Boards SCRIBE, am scribing through remote communication methods for LWerner Lean FNP on 04/03/22 at 12:39 PM.          Dr. LWerner Lean DNP, FNP-BC

## 2022-04-03 NOTE — Telephone Encounter (Signed)
Colon cancer screening due 04/2022.  I have ordered a Cologuard.  Please inform patient to expect a delivery of a kit in December.

## 2022-04-03 NOTE — Telephone Encounter (Signed)
Patient informed. 

## 2022-04-05 ENCOUNTER — Encounter (HOSPITAL_BASED_OUTPATIENT_CLINIC_OR_DEPARTMENT_OTHER): Payer: Self-pay | Admitting: PHYSICIAN ASSISTANT

## 2022-04-05 ENCOUNTER — Ambulatory Visit (HOSPITAL_BASED_OUTPATIENT_CLINIC_OR_DEPARTMENT_OTHER): Admitting: PHYSICIAN ASSISTANT

## 2022-04-05 VITALS — Temp 97.1°F | Ht 63.0 in | Wt 161.0 lb

## 2022-04-05 NOTE — Progress Notes (Addendum)
CHIEF COMPLAINT: Chronic bilateral knee pain    HISTORY OF PRESENT ILLNESS: Rachel [name] is a pleasant 71 year old female who presents today regarding chronic bilateral knee pain.  Rachel [name] states she has been dealing with persistent aching pain in her bilateral knees with increased pain on the medial aspect of her bilateral knees over the past few years but has noticed increased symptoms of the past few months.  She states that she is having difficulty with going up and down stairs walking on hills as well as playing pickle ball which she thoroughly enjoys doing.  She has recently completed an intra-articular steroid injection to the right knee completed by her primary care provider.  She states the injection has done very well so far.  She has not tried any other conservative measures.  She has also recently completed an x-ray which was later reviewed in this note.    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     Primary osteoarthritis of knees, bilateral     Trigger middle finger of right hand        MEDICATIONS:   Current Outpatient Medications:     CALCIUM CARBONATE (CALCIUM 500 PO), Take 500 mg by mouth every day., Disp: , Rfl:     diphenhydramine HCl (ANTIHISTAMINE PO), 1-2 tablets daily if needed      , Disp: , Rfl:     FLAX SEED OIL 1,000 mg Capsule, Take 1,000 mg by mouth every day., Disp: , Rfl:     Fluticasone (FLONASE ALLERGY RELIEF) 50 mcg/actuation nasal spray, Instill 2 sprays into EACH nostril every day., Disp: 10 mL, Rfl: 2    IBUPROFEN PO, Take by mouth., Disp: , Rfl:     LACTOBACILLUS ACIDOPHILUS (PROBIOTIC PO), Take 1 capsule by mouth 2 times daily., Disp: , Rfl:     MULTIVITAMIN PO, Take 1 tablet by  mouth every day., Disp: , Rfl:     TUMERIC-GING-OLIVE-OREG-CAPRYL PO, Take 500 mg by mouth. 1 DAILY, Disp: , Rfl:     ValACYclovir (VALTREX) 1 gram tablet, Take 1 tablet by mouth. 2 in am and 2 in pm as needed, Disp: , Rfl:     ALLERGIES: Patient has no known allergies.    PAST SURGICAL HISTORY:   Past Surgical History:   Procedure Laterality Date    COLONOSCOPY  05/03/2012    Dr. Maisie Fus.  Next one due in 04/2022       SOCIAL HISTORY:   Social History     Socioeconomic History    Marital status: MARRIED     Spouse name: Aidana, Kamrath    Number of children: 3    Years of education: Not on file    Highest education level: Not on file   Occupational History    Occupation: Retired   Tobacco Use    Smoking status: Never    Smokeless tobacco: Never   Vaping Use    Vaping Use: Never used   Substance and Sexual Activity    Alcohol use: Yes     Alcohol/week: 7.0 - 14.0 standard drinks of alcohol     Types: 7 - 14 Glasses of wine  per week    Drug use: No    Sexual activity: Yes     Partners: Male   Other Topics Concern    Military Service No    Blood Transfusions No    Caffeine Concern No    Occupational Exposure No    Hobby Hazards No    Sleep Concern No    Stress Concern No    Weight Concern Yes    Special Diet Yes    Back Care No    Exercise Yes    Bike Helmet Yes    Seat Belt Yes    Self-Exams Yes   Social History Narrative    BLANK     Social Determinants of Health     Financial Resource Strain: Low Risk  (01/13/2021)    Overall Financial Resource Strain (CARDIA)     Difficulty of Paying Living Expenses: Not hard at all   Food Insecurity: No Food Insecurity (01/13/2021)    Food Insecurity     Worried About Estate manager/land agent of Food in the Last Year: Never true     Ran Out of Food in the Last Year: Never true     Worried About Quality of Food: Not on file   Transportation Needs: No Transportation Needs (01/13/2021)    PRAPARE - Armed forces logistics/support/administrative officer (Medical): No     Lack of Transportation (Non-Medical): No    Physical Activity: Sufficiently Active (01/13/2021)    Exercise Vital Sign     Days of Exercise per Week: 5 days     Minutes of Exercise per Session: 60 min   Stress: No Stress Concern Present (01/13/2021)    [place]     Feeling of Stress : Only a little   Social Connections: Socially Integrated (01/13/2021)    Social Connection and Isolation Panel [NHANES]     Frequency of Communication with Friends and Family: More than three times a week     Frequency of Social Gatherings with Friends and Family: More than three times a week     Attends Religious Services: More than 4 times per year     Active Member of [employer] or Organizations: Yes     Attends Music therapist: More than 4 times per year     Marital Status: Married   Human resources officer Violence: Not At Risk (01/13/2021)    Humiliation, Afraid, Rape, and Kick questionnaire     Fear of Current or Ex-Partner: No     Emotionally Abused: No     Physically Abused: No     Sexually Abused: No   Housing Stability: Unknown (01/13/2021)    Housing Stability Vital Sign     Unable to Pay for Housing in the Last Year: No     Number of Places Lived in the Last Year: Not on file     Unstable Housing in the Last Year: No       FAMILY HISTORY:   Family History   Problem Relation Name Age of Onset    Hypertension Mother      Chronic Renal Disease Mother      Other (Prostate Cancer with bone mets) Father      No Known Problems Sister      No Known Problems Brother      No Known Problems Maternal Grandmother      No Known Problems Maternal Grandfather      No Known  Problems Paternal Grandmother      No Known Problems Paternal Grandfather      Breast Cancer Other female cousin     No Known Problems Sister      No Known Problems Daughter      No Known Problems Daughter      No Known Problems Son         ROS: A 14 point review of systems is reviewed and is negative with the exception of what is listed in the  HPI.    Vitals:    04/05/22 1016   Temp: 36.2 C (97.1 F)   TempSrc: Temporal   Weight: 73 kg (161 lb)   Height: 1.6 m (5\' 3" )     Body mass index is 28.52 kg/m.    PHYSICAL EXAMINATION  GENERAL: Well appearing, in no apparent distress.  PSYCHOLOGICAL: Alert and oriented x3, mood and affect appropriate for visit.  EYES: Extraocular movements are intact, no scleral icterus.  NEUROLOGICAL: CN II-XII grossly intact.  SKIN: Skin overlying the injured/operative extremity shows no signs of skin changes or rashes.  RESPIRATORY: Normal respiratory effort with no audible wheezing.  CARDIOVASCULAR: The patient has a 2+ radial pulse, symmetric bilaterally.  MUSCULOSKELETAL:    Right Knee:  No effusion or erythema, no ecchymosis present, no significant swelling  ROM: 0/0/120  Strength: 5/5 flexion, 5/5 extension  + Patellar Grind  (-) Varus/valgus  (-) McMurrays  (-) Lachmans  NVI Distally    Left Knee:  + mild effusion  ROM: 0/0/120  Strength: 5/5 flexion, 5/5 extension  + Patellar Grind  (-) Varus/valgus  (-) McMurrays  (-) Lachmans  NVI Distally    DIAGNOSTIC IMAGING:    XR OF BILATERAL KNEES COMPLETE - 03/29/2022  FINDINGS:  Right knee:   There is subjectively decreased bone mineralization without evidence for acute fracture. There is slight lateral maltracking of the patella reactive to the femoral trochlea. Mild to moderate tricompartmental right knee osteoarthritic changes are noted, evidenced by joint space narrowing, subchondral sclerosis and small marginal osteophytes, and showing predominance at the lateral facet of the patellofemoral joint and at the medial compartment. Small joint effusion is present. Soft tissue planes at the knee are preserved.  Left knee:   There is subjectively decreased bone mineralization without evidence of acute left knee fracture. Patellar tracking is neutral. Small knee joint effusion is present. There is mild tricompartmental left knee osteoarthritis showing medial compartment  predominance. Soft tissue planes are preserved.     IMPRESSION:  1. Mild to moderate right knee osteoarthritis showing patellofemoral compartment and medial compartment predominance.  2. Mild left knee osteoarthritis showing medial compartment prominence.  3. Subjectively decreased bone mineralization without acute fracture at either knee.  4. Small bilateral knee joint effusions.     IMPRESSION: Based on the patient's physical exam, complaints and recent imaging studies we suspect her pain and symptoms are due to primary osteoarthritis of the bilateral knees.  We did discuss with the patient completing conservative measures including physical therapy, ice, brace, and oral/topical anti-inflammatories.  We will [facility] referral to [facility] and possible for her to start physical therapy for bilateral knees.  In the meantime she can continue with topical and oral anti-inflammatories as needed.  We also discussed with the patient injection therapy.  She states her current steroid injection is working very well she is very pleased with the result so far.  We discussed with her also trying hyaluronic acid injections in the future.  She is amenable to this plan and may try injection therapy with [place] in the future as needed.  She can follow-up with our office for reevaluation in 3 months and/or sooner if she decides she would like to try injections.    PLAN: Follow-up in 3 months for reevaluation.    [facility] PA-C  [facility]  [address] [place], [place]. [address] [place], [place] [address]  [phone]      This note was generated using Programmer, multimedia.  The best effort was made to avoid grammatical errors, but they still may exist.    I have personally reviewed the images, findings, and impression and plan and I agree with [facility] PA-C assessment above.  I have discussed the care plan with [facility] and am readily available for any questions or concerns that may  arise.

## 2022-04-17 ENCOUNTER — Telehealth: Payer: Self-pay | Admitting: Family Medicine

## 2022-04-17 DIAGNOSIS — Z1231 Encounter for screening mammogram for malignant neoplasm of breast: Secondary | ICD-10-CM

## 2022-04-17 NOTE — Telephone Encounter (Signed)
Mammogram ordered and due on 05/11/2022.  Please contact patient to schedule.

## 2022-04-17 NOTE — Telephone Encounter (Signed)
Pt scheduled  

## 2022-04-24 LAB — COLOGUARD: Cologuard_EXT: NEGATIVE

## 2022-05-18 ENCOUNTER — Ambulatory Visit
Admission: RE | Admit: 2022-05-18 | Discharge: 2022-05-18 | Disposition: A | Discharge status: Home / Self Care | Source: Ambulatory Visit | Attending: Family Medicine | Admitting: Family Medicine

## 2022-07-31 ENCOUNTER — Encounter (HOSPITAL_BASED_OUTPATIENT_CLINIC_OR_DEPARTMENT_OTHER): Payer: Self-pay | Admitting: Family Medicine

## 2022-07-31 DIAGNOSIS — J301 Allergic rhinitis due to pollen: Secondary | ICD-10-CM

## 2022-07-31 NOTE — Telephone Encounter (Signed)
From: Jani Files  To: Eustaquio Boyden Ceridon  Sent: 07/31/2022 11:15 AM PDT  Subject: Refill    I would like to request a refill for Futicasone Propionate.  Thank you,   Pamala Hurry

## 2022-08-02 ENCOUNTER — Encounter (HOSPITAL_BASED_OUTPATIENT_CLINIC_OR_DEPARTMENT_OTHER): Payer: Self-pay | Admitting: Pediatrics

## 2022-08-02 MED ORDER — FLUTICASONE PROPIONATE 50 MCG/ACTUATION NASAL SPRAY,SUSPENSION
2.0000 | Freq: Every day | NASAL | 2 refills | Status: DC
Start: 2022-08-02 — End: 2022-08-30

## 2022-08-02 NOTE — Telephone Encounter (Signed)
Spoke with patient [facility] [place] is okay

## 2022-08-30 ENCOUNTER — Other Ambulatory Visit (HOSPITAL_BASED_OUTPATIENT_CLINIC_OR_DEPARTMENT_OTHER): Payer: Self-pay | Admitting: Family Medicine

## 2022-08-30 DIAGNOSIS — J301 Allergic rhinitis due to pollen: Secondary | ICD-10-CM

## 2022-08-30 MED ORDER — FLUTICASONE PROPIONATE 50 MCG/ACTUATION NASAL SPRAY,SUSPENSION: 2.0000 | Freq: Every day | NASAL | 2 refills | Status: AC

## 2022-08-30 NOTE — Telephone Encounter (Signed)
Please verify correct pharmacy  Please ensure 90 day supply for mail order pharmacies.  Please verify prescription days equals quantity x refills  BP Readings from Last 1 Encounters:   03/29/22 (!) 142/78      Pulse Readings from Last 1 Encounters:   04/03/22 (P) 64     Lab Results   Component Value Date    WBC 6.3 07/23/2019    HGB 14.0 07/23/2019    PLT 273 07/23/2019    NA 138 01/20/2022    K 3.8 01/20/2022    GLU 92 01/20/2022    CR 0.61 01/20/2022    ALT 28 01/20/2022     Recent Visits  Date Type Provider Dept   04/17/22 Telephone Ceridon, Clelia Schaumann, MD [facility]   04/03/22 Telephone Ceridon, Clelia Schaumann, MD [facility]   04/03/22 Office Visit Danna Hefty, F[facility]   03/29/22 Telephone Ceridon, Clelia Schaumann, MD [facility]   03/29/22 Office Visit Danna Hefty, FNP [facility]   01/24/22 Office Visit Ceridon, Clelia Schaumann, MD [facility]   Showing recent visits within past 365 days with a meds authorizing provider and meeting all other requirements  Future Appointments  Date Type Provider Dept   01/25/23 Appointment Ceridon, Clelia Schaumann, MD [facility]   Showing future appointments within next 180 days with a meds authorizing provider and meeting all other requirements        [facility] [address]           User Date Status                    Current Medications  has a current medication list which includes the following prescription(s): calcium carbonate, triprolidine/pseudoephedrine, flax seed oil, fluticasone, ibuprofen, lactobacillus acidophilus, multivitamin, turmeric/ging/olive/oreg/capry, and valacyclovir.

## 2023-01-18 ENCOUNTER — Encounter (HOSPITAL_BASED_OUTPATIENT_CLINIC_OR_DEPARTMENT_OTHER): Payer: Self-pay | Admitting: Internal Medicine

## 2023-01-24 ENCOUNTER — Ambulatory Visit: Attending: Family Medicine

## 2023-01-24 LAB — VITAMIN D, 25 HYDROXY: Vitamin D, 25 Hydroxy: 32.6 ng/mL (ref 30–80)

## 2023-01-24 LAB — LIPID PANEL
Cholesterol: 243 mg/dL — ABNORMAL HIGH (ref <200)
HDL Cholesterol: 83.6 mg/dL (ref >=40.0)
LDL Cholesterol Calculation: 136 mg/dL — ABNORMAL HIGH (ref <100)
Non-HDL Cholesterol: 159.4 mg/dL — ABNORMAL HIGH (ref <=150.0)
Total Cholesterol: HDL Ratio: 2.9 mg/dL (ref 2.0–5.0)
Triglyceride Level: 118 mg/dL (ref <150)

## 2023-01-24 LAB — COMPREHENSIVE METABOLIC PANEL
Adjusted Calcium: 9.1 mg/dL (ref 8.7–10.2)
Alanine Transferase (ALT): 20 U/L (ref 7–52)
Alb/Glob Ratio: 1.6 (ref 1.0–1.6)
Albumin: 4.4 g/dL (ref 3.5–5.7)
Alkaline Phosphatase (ALP): 65 U/L (ref 34–104)
Anion Gap: 8 mmol/L (ref 7–15)
Aspartate Transaminase (AST): 18 U/L (ref 13–39)
BUN/ Creatinine: 24.6 — ABNORMAL HIGH (ref 7.3–21.7)
Bilirubin Total: 0.6 mg/dL (ref 0.3–1.0)
Calcium: 9.4 mg/dL (ref 8.6–10.3)
Carbon Dioxide Total: 25 mmol/L (ref 21–31)
Chloride: 105 mmol/L (ref 98–107)
Creatinine Serum: 0.61 mg/dL (ref 0.60–1.20)
Globulin: 2.8 g/dL (ref 2.2–4.2)
Glucose: 100 mg/dL (ref 74–109)
Potassium: 4.4 mmol/L (ref 3.5–5.1)
Protein: 7.2 g/dL (ref 6.4–8.9)
Sodium: 138 mmol/L (ref 136–145)
Urea Nitrogen, Blood (BUN): 15 mg/dL (ref 7–25)
eGFR Creatinine (Female): 96 mL/min/{1.73_m2} (ref >60)

## 2023-01-24 LAB — LIPASE: Lipase: 22 U/L (ref 11–82)

## 2023-01-25 ENCOUNTER — Ambulatory Visit (HOSPITAL_BASED_OUTPATIENT_CLINIC_OR_DEPARTMENT_OTHER): Admitting: Family Medicine

## 2023-01-25 ENCOUNTER — Encounter (HOSPITAL_BASED_OUTPATIENT_CLINIC_OR_DEPARTMENT_OTHER): Payer: Self-pay | Admitting: Family Medicine

## 2023-01-25 NOTE — Progress Notes (Signed)
You have access to this medical note to help you improve your understanding of what was discussed during the office visit.  Medical notes are meant to be a communication tool between medical professionals and require medical terminology to be used for efficiency.      Furthermore, sections of this Progress Note may have been "cut and pasted" from Progress Notes written during previous appointments.  The information contained within these notes have been reviewed and changed as appropriate prior to the signing of this note.  Any information left unchanged was left intact as it was deemed still relevant during said review.    If you have questions about the meaning of the phrasing or medical terminology being used - or you have questions about the process for creating this note in-general - please bring it up at your next follow up appointment or send [place] a MyChart message with your questions.     HPI:  Rachel [name] is a 72yr-old female who is here today for a Med[facility] Annual Exam.    We reviewed PMH/PSH, family and social histories.  I obtained further information when needed and entered it into the EMR chart.    She report her knee pain has gotten better. However, she has noticed her gait has changed since the pain appeared. She can't bend down to squat correctly either.     I recommended going to physical therapy.    She has no new healthcare needs or issues that need to be immediately addressed.  We discussed other chronic health issues.    We reviewed and discussed the results of labs most recently obtained prior to this appointment as well as results from previous labs for comparison and trending.    Labs from 01/24/2023 revealed Cholesterol is good. Vitamin D is in the low normal range.  I recommended supplementation.    There is evidence for mild dehydration.  We discussed fluid intake.    The results otherwise revealed no concerns or signficant abnormalities.  They indicate that various health  conditions are stable.     In addition to the above-noted recommendations, we discussed other management options moving forward - from doing nothing to more aggressive options - as well as the risks and benefits of each option.  Throughout the discussion, she was given an opportunity to ask questions.  These were answered to her stated satisfaction.  She verbalized understanding and agreed with the plan of care.    She is otherwise doing well and denies any other concerns.  No further intervention is needed today as there are no changes that need to be made in management of these conditions.     ROS:  A Review of Systems performed. Pertinent details discussed in HPI. Otherwise, denies any issues or other physical concerns    Vital Signs:  Pulse (P) 86, temperature (P) 36.1 C (97 F), temperature source (P) Temporal, resp. rate (P) 22, height (P) 1.6 m (5\' 3" ), weight (P) 73.4 kg (161 lb 14.4 oz), SpO2 (P) 96%.    Physical Exam  Vitals reviewed.   Constitutional:       General: She is not in acute distress.     Appearance: She is well-developed.   HENT:      Head: Normocephalic and atraumatic.      Right Ear: External ear normal.      Left Ear: External ear normal.      Nose: Nose normal.      Mouth/Throat:  Mouth: Oropharynx is clear and moist.      Pharynx: No oropharyngeal exudate.   Eyes:      General: No scleral icterus.     Extraocular Movements: EOM normal.      Conjunctiva/sclera: Conjunctivae normal.      Pupils: Pupils are equal, round, and reactive to light.   Neck:      Thyroid: No thyromegaly.      Vascular: No JVD.   Cardiovascular:      Rate and Rhythm: Normal rate and regular rhythm.      Heart sounds: Normal heart sounds. No murmur heard.     No friction rub. No gallop.   Pulmonary:      Effort: Pulmonary effort is normal. No respiratory distress.      Breath sounds: Normal breath sounds. No wheezing or rales.   Abdominal:      General: Bowel sounds are normal. There is no distension.       Palpations: Abdomen is soft. There is no mass.      Tenderness: There is no abdominal tenderness.      Hernia: No hernia is present.   Musculoskeletal:         General: No tenderness or deformity. Normal range of motion.      Cervical back: Normal range of motion and neck supple.   Lymphadenopathy:      Cervical: No cervical adenopathy.   Skin:     General: Skin is warm and dry.   Neurological:      Mental Status: She is alert and oriented to person, place, and time.      Cranial Nerves: No cranial nerve deficit.      Motor: No abnormal muscle tone.      Coordination: Coordination normal.      Deep Tendon Reflexes: Reflexes are normal and symmetric. Reflexes normal.     Preventative Healthcare Maintenance    Preventative Questionnaires  Medicare Questionnaire (Health Risk Assessment) -Scanned into patient's medical record.    All questionnaires were reviewed with the patient and the appropriate counseling was discussed.     Reviewed allergies, past medical history, past surgical history, family history, social history, and current medications and supplements.     Advanced Care Planning  The patient /  surrogate was counseled about the importance of Advance Care Planning. I assured the patient that such planning is voluntary and not a requirement to receive medical care.    The patient/surrogate and I reviewed ACP Documents. The documents in EMR are up to date.  Advanced Directives are dated 03/27/2005.  POLST Form is dated 08/08/2020.       Vaccinations  - Flu vaccination:  Sporadically.  Last one on record - 03/21/2022  - SARS-Cov2 vaccaintion:    - Moderna: 07/25/2019; 08/22/2019; 05/20/2020; 09/21/2020; 03/04/2021; 03/21/2022  RSV vaccination: Discussed and recommended  - Pneumococcal vaccination:    - PCV13:  09/19/2017  - PCV20: 01/25/2023  - Td/TdaP vaccination:    - TdaP:  08/11/2015  - History of chicken pox:  Unsure  - History of Shingles Outbreak:  No  - Shingles Vaccination:    - Zostavax:  08/11/2015.     - Shingrix:  Discussed and recommended  - Hepatitis A/B Vaccination:    - Twinrix:  Discussed and recommended     Cardiovascular Health  An EKG was during the Welcome to Med[facility]Care Exam.  It revealed NSR.  Axises, wave intervals and wave morphology were all within expect parameters.  Pulmonary Health  Due to patient's smoking history, a Lung Cancer Screen (Low-dose Chest CT) is not appropriate.       Routine [facility] provided by Cassell Clement.  - LMP:  ~2011  - Last Pap/pelvic:  06/27/2013.  Recommended returning to Gyn       - h/o abnormal exam:  No  - Last Mammogram:  05/18/2022  Next one due in 04/2023       - h/o abnormal mammograms:  No     Bone Health  - Last DEXA Scan:  05/11/2021  Next one due in 04/2023  - Last Results:  Stable Osteoporosis with a T-Score to -2.6     GI Health  - Last Colon Cancer Screening via Cologuard on 04/18/2022 - negative  - Last colonoscopy in 05/03/2012       - Monitoring schedule:  Next colon cancer screen due in 03/2025  - Hepatitis C Screen:  Refused     Ocular Health  - Routine visits with eye specialist:  Annual exams with glaucoma screens  We discussed the importance of routine eye care to monitor for conditions such as glaucoma and cataracts.     Dental Health   - Routine dental visits:  Semiannual cleanings.  Annual check ups with X-Rays   We discussed the importance for continued dental care.     Dermatologic Health  - Routine visits to a Dermatologist:  Established with Dr. No.  Routine skin checks performed.  We discussed the importance of regular skin checks - both personal (with aid of family members) and by a medical professional.  We also discussed the need for skin protection from the sun.     Miscellaneous Preventative Healthcare Maintenance Items     The patient is negative for cognitive impairment.      Opioid Risk Screening: N/A     Substance Use Screening: N/A     Hearing/Fall Prevention: Discussed with patient. See orders as  appropriate.     Stop Smoking Counseling: n/a     Reduce Alcohol Intake Counseling:yes     Exercise and Nutrition Counseling: Discussed with patient and handouts provided.     A written updated summary of Medicare Annual Wellness Visit was given to this patient. It details what healthcare maintenance has been done and when they are due for their next subsequent Medicare Annual Wellness Visit    Assessment and Plan:    ICD-10-CM    1. Encounter for Medicare annual wellness exam  Z00.00       2. Counseling regarding end of life decision making  Z71.89       3. Immunization counseling  Z71.85       4. Chronic pain of both knees  M25.561 Physical Therapy Referral    M25.562     G89.29       5. 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 Weakness of trunk musculature  M62.81 Physical Therapy Referral      7. Weakness of both lower extremities  R29.898 Physical Therapy Referral      8. Chronic dysfunction of right eustachian tube  H69.91       9. Chronic seasonal allergic rhinitis due to pollen  J30.1       10. Mixed hyperlipidemia  E78.2 Comprehensive Metabolic Panel     Lipid Panel      11. Age-related osteoporosis without current pathological fracture  M81.0 [facility] DEXA     Comprehensive Metabolic Panel      12. Vitamin D deficiency  E55.9 Comprehensive Metabolic Panel     Vitamin D, 25 Hydroxy      13. Diverticulosis of large intestine without hemorrhage  K57.30       14. Irritable bowel syndrome with diarrhea  K58.0       15. Trigger middle finger of right hand  M65.331       16. Dense breasts  R92.30       17. Screening for depression  Z13.31       18. Screening for substance abuse  Z13.89       19. Breast cancer screening by mammogram  Z12.31 [facility] MAMMOGRAM BREAST SCREENING W/TOMO      20. Need for pneumococcal 20-valent conjugate vaccination  Z23 PNEUMOCOCCAL CONJUGATE PCV20, PF (PREVNAR 20) VACCINE, IM        RTC in 1 Year for MediCare Annual Exam.    I,  Sunday Corn, SCRIBE, am scribing through remote communication methods for Reola Calkins, MD on 01/25/23 at 11:12 AM.      I, Reola Calkins, MD, personally performed the services scribed in this documentation, as scribed by Sunday Corn, SCRIBE during the encounter with the patient and have reviewed the documentation and attest that it is accurate and complete.      Patient History  Past Medical History[1]    Past Surgical History[2]    OB History   Gravida Para Term Preterm AB Living   5 3 3  0 2 3   SAB IAB Ectopic Multiple Live Births   1 1 0 0 3       Family History[3]    Social History     Socioeconomic History    Marital status: MARRIED     Spouse name: Elainna, Jackovich    Number of children: 3    Years of education: Not on file    Highest education level: Not on file   Occupational History    Occupation: Retired   Tobacco Use    Smoking status: Never    Smokeless tobacco: Never   Vaping Use    Vaping status: Never Used   Substance and Sexual Activity    Alcohol use: Yes     Alcohol/week: 7.0 - 14.0 standard drinks of alcohol     Types: 7 - 14 Glasses of wine per week    Drug use: No    Sexual activity: Yes     Partners: Male   Other Topics Concern    Military Service No    Blood Transfusions No    Caffeine Concern No    Occupational Exposure No    Hobby Hazards No    Sleep Concern No    Stress Concern No    Weight Concern Yes    Special Diet Yes    Back Care No    Exercise Yes    Bike Helmet Yes    Seat Belt Yes    Self-Exams Yes   Social History Narrative    BLANK     Social Determinants of Health     Financial Resource Strain: Low Risk  (01/13/2021)    Overall Financial Resource Strain (CARDIA)     Difficulty of Paying Living Expenses: Not hard at all   Food Insecurity: No Food Insecurity (01/13/2021)    Food Insecurity     Worried About Running Out of Food in the Last  Year: Never true     Ran Out of Food in the Last Year: Never true     Worried About Quality of Food: No   Transportation Needs: No  Transportation Needs (01/13/2021)    PRAPARE - Therapist, art (Medical): No     Lack of Transportation (Non-Medical): No   Physical Activity: Sufficiently Active (01/13/2021)    Exercise Vital Sign     Days of Exercise per Week: 5 days     Minutes of Exercise per Session: 60 min   Stress: No Stress Concern Present (01/13/2021)    [employer] of Occupational Health - Occupational Stress Questionnaire     Feeling of Stress : Only a little   Social Connections: Socially Integrated (01/13/2021)    Social Connection and Isolation Panel [NHANES]     Frequency of Communication with Friends and Family: More than three times a week     Frequency of Social Gatherings with Friends and Family: More than three times a week     Attends Religious Services: More than 4 times per year     Active Member of [employer] or Organizations: Yes     Attends Engineer, structural: More than 4 times per year     Marital Status: Married   Catering manager Violence: Not At Risk (01/13/2021)    Humiliation, Afraid, Rape, and Kick questionnaire     Fear of Current or Ex-Partner: No     Emotionally Abused: No     Physically Abused: No     Sexually Abused: No   Housing Stability: Unknown (01/13/2021)    Housing Stability Vital Sign     Unable to Pay for Housing in the Last Year: No     Number of Places Lived in the Last Year: Not on file     Unstable Housing in the Last Year: No            [1]   Past Medical History:  Diagnosis Date    Chronic dysfunction of right eustachian tube     Chronic seasonal allergic rhinitis due to pollen     Diverticulosis of large intestine without hemorrhage     Dyslipidemia     Irritable bowel syndrome with diarrhea     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 Eddie Candle.  Next one due in  04/2022   [3]   Family History  Problem Relation Name Age of Onset    Hypertension Mother      Chronic Renal Disease Mother      Other (Prostate Cancer with bone mets) Father      No Known Problems Sister      No Known Problems Brother      No Known Problems Maternal Grandmother      No Known Problems Maternal Grandfather      No Known Problems Paternal Grandmother      No Known Problems Paternal Grandfather      Breast Cancer Other female cousin     No Known Problems Sister      No Known Problems Daughter      No Known Problems Daughter      No Known Problems Son

## 2023-01-25 NOTE — Patient Instructions (Addendum)
Maintain good fluid intake  1.5 - 2L daily, unless otherwise advised by a specialist such as a Cardiologist.  Water should be the primary source of fluids.  Milk and juice are also acceptable.  It is OK to use flavor tablets (e.g. NUUN and/or Crystal Light)  This daily total should not include caffenated drinks, soft drinks or alcohol as they are diuretics, increase the amount of urine your body produces.  Recommendations to easily monitor the amount of fluid you drink in a day.  Keep a 1-2 L bottle of water in the refrigerator.  Drink ~1 L before Lunch and another 1/2 L before dinner.  Keep a 1/2 L water bottle with you at all times, try to drink enough water to require refilling this bottle at least 3 times.  Try not to drink too much after dinner as this may increase the amount of times you wake up in the middle of the night to urinate.    Preventative Healthcare Maintenance Review and Recommendations    Advanced Care Planning  End of Life Care decisions as well as the documents associated with such decisions are voluntary and not a requirement to receive medical care.  However, they are meant to protect you and the decision.  They are also for your family when you have reached that point in your life.    Vaccinations  - Flu vaccination:  Flu shots between pumpkins and turkeys  - SARS-Cov2 vaccaintion:    - Moderna: 07/25/2019; 08/22/2019; 05/20/2020; 09/21/2020; 03/04/2021; 03/21/2022  RSV vaccination: I recommend this vaccination.  Talk with your pharmacy.  - Pneumococcal vaccination:    - PCV13:  09/19/2017  - PCV20: Discussed and recommended   - Td/TdaP vaccination:    - TdaP:  08/11/2015  - History of chicken pox:  Unsure  - History of Shingles Outbreak:  No  - Shingles Vaccination:    - Zostavax:  08/11/2015.    - Shingrix:  I recommend this vaccination.  Talk with your pharmacy.  - Hepatitis A/B Vaccination:    - Twinrix:  I recommend consideration for this vaccination series.  Talk with your pharmacy.      Cardiovascular Health  An EKG was during the Welcome to [facility] Exam.  It revealed NSR.  Axises, wave intervals and wave morphology were all within expect parameters.     Pulmonary Health  Due to your smoking history, a Lung Cancer Screen (Low-dose Chest CT) is not appropriate.       Routine [facility] provided by Cassell Clement.  - LMP:  ~2011  - Last Pap/pelvic:  06/27/2013.  Recommended returning to Gyn       - h/o abnormal exam:  No  - Last Mammogram:  05/18/2022  Next one due in 04/2023       - h/o abnormal mammograms:  No     Bone Health  - Last DEXA Scan:  05/11/2021  Next one due in 04/2023  - Last Results:  Stable Osteoporosis with a T-Score to -2.6     GI Health  - Last Colon Cancer Screening via Cologuard on 04/18/2022 - negative  - Last colonoscopy in 05/03/2012       - Monitoring schedule:  Next colon cancer screen due in 03/2025  - Hepatitis C Screen:  Consider this blood test.    Ocular Health  I recommend routine visits with eye specialist to monitor for conditions such as glaucoma and cataracts.    Dental Health  I recommend routine dental visits for regular cleanings and check ups with X-Rays.    Dermatologic Health  I recommend regular skin checks - both personal (with aid of family members) and by a medical professional.  I also recommend skin protection - either through application of sun screen or through clothing (hats, over garments) - when spending time out in the sun.

## 2023-01-31 ENCOUNTER — Encounter (HOSPITAL_BASED_OUTPATIENT_CLINIC_OR_DEPARTMENT_OTHER): Payer: Self-pay | Admitting: Family Medicine

## 2023-02-21 ENCOUNTER — Encounter (HOSPITAL_BASED_OUTPATIENT_CLINIC_OR_DEPARTMENT_OTHER): Payer: Self-pay | Admitting: Family Medicine

## 2023-02-21 ENCOUNTER — Other Ambulatory Visit (HOSPITAL_BASED_OUTPATIENT_CLINIC_OR_DEPARTMENT_OTHER): Payer: Self-pay | Admitting: Family Medicine

## 2023-04-17 ENCOUNTER — Encounter (HOSPITAL_BASED_OUTPATIENT_CLINIC_OR_DEPARTMENT_OTHER): Payer: Self-pay | Admitting: Family Medicine

## 2023-05-12 ENCOUNTER — Encounter (HOSPITAL_BASED_OUTPATIENT_CLINIC_OR_DEPARTMENT_OTHER): Payer: Self-pay

## 2023-05-21 ENCOUNTER — Ambulatory Visit
Admission: RE | Admit: 2023-05-21 | Discharge: 2023-05-21 | Disposition: A | Discharge status: Home / Self Care | Source: Ambulatory Visit | Attending: Family Medicine | Admitting: Family Medicine

## 2023-06-05 ENCOUNTER — Encounter (HOSPITAL_BASED_OUTPATIENT_CLINIC_OR_DEPARTMENT_OTHER): Payer: Self-pay

## 2023-09-20 ENCOUNTER — Telehealth (HOSPITAL_BASED_OUTPATIENT_CLINIC_OR_DEPARTMENT_OTHER): Payer: Self-pay | Admitting: Family Medicine

## 2023-09-20 ENCOUNTER — Ambulatory Visit: Attending: Family Medicine

## 2023-09-20 ENCOUNTER — Ambulatory Visit (HOSPITAL_BASED_OUTPATIENT_CLINIC_OR_DEPARTMENT_OTHER): Payer: Self-pay | Admitting: Family Medicine

## 2023-09-20 DIAGNOSIS — R0989 Other specified symptoms and signs involving the circulatory and respiratory systems: Secondary | ICD-10-CM

## 2023-09-20 DIAGNOSIS — R0981 Nasal congestion: Secondary | ICD-10-CM

## 2023-09-20 DIAGNOSIS — R519 Headache, unspecified: Secondary | ICD-10-CM

## 2023-09-20 DIAGNOSIS — R051 Acute cough: Secondary | ICD-10-CM

## 2023-09-20 LAB — MMC RESPIRATORY VIRUS PANEL BY PCR
Coronavirus by PCR MMC: NOT DETECTED
Influenza A by PCR: NOT DETECTED
Influenza B by PCR: NOT DETECTED
RSV by PCR MMC: NOT DETECTED

## 2023-09-20 NOTE — Telephone Encounter (Signed)
 In the past 2 weeks have you tested positive for covid-19 or been diagnosed with covid-19? no  Over the past 2 weeks have you had unexplained or worsening symptoms of the following?     Onset of symptoms: 4/26   Symptoms:  Headache? YES  Nausea? no  Vomiting? no  Diarrhea? no  Fever (<65 yrs, >100.0 ; > 65 yrs, >99.5)?  no  Highest Temp? N/a F, Current Temp (if taken)  Shortness of breath? none    Cough? YES  New loss of sense of smell/taste? no  Sore throat?  no  Chills? no  Myalgia/Arthralgias? no  Fatigue? YES  Pregnant? no  Congestion or runny nose? YES    Exposure:  In the last 2 weeks have you had contact with someone sick with covid-19?  no  In the last 2 weeks have you lived in a place where covid-19 is spreading? (Example, including but not limited too; board and care, nursing home, jail, prison, cramped living situation) no  Employed in a healthcare setting? no  In the last 2 weeks have you traveled outside the [place] ? no If so, where? N/a    Testing:  Previously tested for COVID-19? No    Cold like symptoms, feels like sinus infection, please call at [phone]. Thank you

## 2023-09-20 NOTE — Telephone Encounter (Signed)
 Swab ordered.

## 2023-09-20 NOTE — Telephone Encounter (Signed)
 Called patient and aware, will go get respiratory swab done.

## 2024-01-17 ENCOUNTER — Encounter (HOSPITAL_BASED_OUTPATIENT_CLINIC_OR_DEPARTMENT_OTHER): Payer: Self-pay | Admitting: Internal Medicine

## 2024-01-18 ENCOUNTER — Ambulatory Visit: Attending: Family Medicine

## 2024-01-18 LAB — LIPID PANEL
Cholesterol: 234 mg/dL — ABNORMAL HIGH (ref <200)
HDL Cholesterol: 82.4 mg/dL (ref >=40.0)
LDL Cholesterol Calculation: 132 mg/dL — ABNORMAL HIGH (ref <100)
Non-HDL Cholesterol: 151.6 mg/dL — ABNORMAL HIGH (ref <=150.0)
Total Cholesterol: HDL Ratio: 2.8 mg/dL (ref 2.0–5.0)
Triglyceride Level: 97 mg/dL (ref <150)

## 2024-01-18 LAB — COMPREHENSIVE METABOLIC PANEL
Adjusted Calcium: 9 mg/dL (ref 8.7–10.2)
Alanine Transferase (ALT): 21 U/L (ref 7–52)
Alb/Glob Ratio: 1.5 (ref 1.0–1.6)
Albumin: 4.3 g/dL (ref 3.5–5.7)
Alkaline Phosphatase (ALP): 62 U/L (ref 34–104)
Aspartate Transaminase (AST): 22 U/L (ref 13–39)
BUN/ Creatinine: 27.8 — ABNORMAL HIGH (ref 7.3–21.7)
Bilirubin Total: 0.9 mg/dL (ref 0.3–1.0)
Calcium: 9.2 mg/dL (ref 8.6–10.3)
Carbon Dioxide Total: 27 mmol/L (ref 21–31)
Chloride: 107 mmol/L (ref 98–107)
Creatinine Serum: 0.54 mg/dL — ABNORMAL LOW (ref 0.60–1.20)
Globulin: 2.8 g/dL (ref 2.2–4.2)
Glucose: 97 mg/dL (ref 74–109)
Potassium: 3.9 mmol/L (ref 3.5–5.0)
Protein: 7.1 g/dL (ref 6.4–8.9)
Sodium: 139 mmol/L (ref 136–145)
Urea Nitrogen, Blood (BUN): 15 mg/dL (ref 6–20)
eGFR Creatinine (Female): 99 mL/min/1.73m*2 (ref >60)

## 2024-01-18 LAB — VITAMIN D, 25 HYDROXY: Vitamin D, 25 Hydroxy: 34.9 ng/mL (ref 30–100)

## 2024-01-25 ENCOUNTER — Encounter (HOSPITAL_BASED_OUTPATIENT_CLINIC_OR_DEPARTMENT_OTHER): Admitting: Family Medicine

## 2024-01-29 ENCOUNTER — Encounter (HOSPITAL_BASED_OUTPATIENT_CLINIC_OR_DEPARTMENT_OTHER): Admitting: Family Medicine

## 2024-02-08 ENCOUNTER — Other Ambulatory Visit (HOSPITAL_BASED_OUTPATIENT_CLINIC_OR_DEPARTMENT_OTHER): Payer: Self-pay | Admitting: Family Medicine

## 2024-05-08 ENCOUNTER — Encounter (HOSPITAL_BASED_OUTPATIENT_CLINIC_OR_DEPARTMENT_OTHER): Payer: Self-pay

## 2024-05-28 ENCOUNTER — Encounter (HOSPITAL_BASED_OUTPATIENT_CLINIC_OR_DEPARTMENT_OTHER): Payer: Self-pay | Admitting: Family Medicine

## 2024-05-28 ENCOUNTER — Ambulatory Visit (HOSPITAL_BASED_OUTPATIENT_CLINIC_OR_DEPARTMENT_OTHER): Admitting: Family Medicine

## 2024-05-28 VITALS — BP 136/78 | HR 81 | Temp 97.0°F | Ht 63.0 in | Wt 163.4 lb

## 2024-05-28 NOTE — Nursing Note (Signed)
 Influenza Vaccination Screening  65 and older    Screen patients for eligibility and administration of influenza immunization      Target Population  Patients who present in the clinic requesting Influenza Vaccination and are 65 yrs or older.    Procedure  MA receives a request from a patient for influenza vaccination and a current order exists within the patients chart.    Screening Checklist for Contraindications to Inactivated Injectable Influenza Vaccination  Ask the patient the following questions:    1. Is the person to be vaccinated age 24 or younger?                                          No    2. Has the person to be vaccinated already received a flu shot   for the 2025-2026 season? No    3. Is the person to be vaccinated running a fever greater than 101 degrees? No    4. Does the person to be vaccinated have an allergy to the vaccine? No    5. Has the person to be vaccinated ever had a reaction to influenza vaccine  in the past? no  6. Has the person to be vaccinated ever had an illness that caused paralysis? No    If the person answers NO to every question administer the following:  If the person answers YES or UNSURE to any question consult with the MD, NP or PA prior to administration.      Preference list order name Description   Fluzone  High Dose for 87yrs and older 0.82mL prefilled syringe, IM

## 2024-05-28 NOTE — Patient Instructions (Signed)
 Well Visit, Over 65: Care Instructions    Please see the Medicare Annual Wellness Visit Summary you were given today that details what healthcare maintenance has been done and when you are next due for healthcare maintenance.    At home, you can help prevent illness with healthy eating, regular exercise, and other steps. We recommend an annual flu vaccine, typically given between August and November each year.    Follow-up care is a key part of your treatment and safety. Be sure to make and go to all appointments, and call us if you are having problems. It's also a good idea to know your test results and keep a list of the medicines you take. MyChart can help with this.    How can you care for yourself at home?  Reach and stay at a healthy weight. This will lower your risk for many problems, such as obesity, diabetes, heart disease, and high blood pressure.  Get at least 30 minutes of exercise on most days of the week. Walking is a good choice. You also may want to do other activities, such as swimming, cycling, or playing tennis or team sports. You should get some exercise daily. Do weight training or resistance exercises to maintain strength. Studies show that if you are stronger physically, you will feel stronger emotionally as well.  Do flexibility activities to maintain range of motion.   Do balance training to improve stability and prevent falls.  Do not smoke. Smoking can make health problems worse. If you need help quitting, talk to [place] about stop-smoking programs and medicines. These can increase your chances of quitting for good.  Always wear sunscreen on exposed skin. Make sure to use a broad-spectrum sunscreen that has a sun protection factor (SPF) of 30 or higher. Use it every day, even when it is cloudy.  See a dentist one or two times a year for checkups and to have your teeth cleaned.  Wear a seat belt in the car.  Limit alcohol to 1 drink a day. Too much alcohol can cause health problems.   Avoid  alcohol completely if you have a family history of alcoholism.    For men and women  Cholesterol. We will tell you how often to check your cholesterol based on your overall health and other things that can increase your risk for heart attack and stroke.  Blood pressure. You will have your blood pressure checked at each visit.   If you are healthy and have a blood pressure below 120/80 mm Hg, have your blood pressure checked at least every 1 to 2 years.  If you have slightly higher or high blood pressure, or are at risk for heart disease, you should also check your blood pressure at home regularly.  Diabetes. If indicated, I ordered tests for diabetes.  Vision. Experts recommend that you have yearly exams for glaucoma and other age-related eye problems.  Hearing. Tell us if you notice any change in your hearing. You can have tests to find out how well you hear.  Colon cancer tests.  We discussed this today.  Heart attack and stroke risk. At least every 4 to 6 years, you should have your risk for heart attack and stroke assessed. We use factors such as your age, blood pressure, cholesterol, and whether you smoke or have diabetes to show what your risk for a heart attack or stroke is over the next 10 years.  Osteoporosis.  Calcium supplementation is only necessary if you do  not get enough from your diet. Dietary sources are recommended first because they are absorbed better. Milk and dairy products such as yogurt are the best sources of calcium. They contain a form of calcium that your body can easily absorb. If you cannot tolerate dairy products, green leafy vegetables such as broccoli, collards, kale, mustard greens, turnip greens, and bok choy or [place] cabbage are good sources of calcium. Other sources of calcium that can help meet your body's calcium needs are salmon and sardines canned with their soft bones, blackstrap molasses, almonds, [place] nuts, sunflower seeds, tahini, and dried beans.  Getting enough  Vitamin D is very important. Vitamin D helps with calcium absorption, supports overall bone health, improves muscle performance and balance, and results in a reduced risk for falls. If you don't get enough vitamin D, all the calcium intake and calcium supplements will not help. Vitamin D can be taken in over the counter supplements. Most adults should take between 1000 IU and 4000 IU daily. Because it is a fat-soluble vitamin, you can take your full dose once a week. There is a lab test for Vitamin D. I recommend a Vitamin D level of between 35 and 80.     For women  Pap test and pelvic exam. You probably no longer need a Pap test.   Breast exam and mammogram. We discussed how often you should have a mammogram, which is an X-ray of your breasts. A mammogram can spot breast cancer before it can be felt and when it is easiest to treat.    For men  Abdominal aortic aneurysm. We evaluated whether you should have a test to check for an aneurysm. This test is indicated if you ever smoked or if your parent, brother, sister, or child has had an aneurysm.    When should you call for help?  Watch closely for changes in your health, and be sure to contact your doctor if you have any problems or symptoms that concern you.    END-OF-LIFE WISHES:  If you have a Living Will / Advanced Directive / Power of Attorney, you can bring it in and we can scan it into your electronic health record.    We appreciate the trust you have placed in [place] to care for you. If there are any other concerns you have, please let us know.

## 2024-05-28 NOTE — Progress Notes (Signed)
 You have access to this medical note to help you improve your understanding of what was discussed during the office visit.  Medical notes are meant to be a communication tool between medical professionals and require medical terminology to be used for efficiency.      Furthermore, sections of this Progress Note may have been cut and pasted from Progress Notes written during previous appointments.  The information contained within these notes have been reviewed and changed as appropriate prior to the signing of this note.  Any information left unchanged was left intact as it was deemed still relevant during said review.    If you have questions about the meaning of the phrasing or medical terminology being used - or you have questions about the process for creating this note in-general - please bring it up at your next follow up appointment or send us  a MyChart message with your questions.     Assessment & Plan  Irritable bowel syndrome  Recent episode of abdominal bloating, discomfort and tenderness, likely exacerbated by stress and dietary factors. Symptoms improved with probiotics.  - Ordered abdominal ultrasound to evaluate abdominal symptoms.  - Continue probiotics.    Mixed hyperlipidemia  Cholesterol levels slightly elevated, with LDL slightly above desired levels. Dietary modifications discussed, emphasizing balance and enjoyment of food.  - Continue current dietary habits with occasional indulgences.    Age-related osteoporosis  Osteopenia noted on previous DEXA scan, stable.  - Continue current management and monitoring.    General health maintenance  Routine health maintenance discussed, including vaccinations and screenings. Advanced directives and pulse form need updating.  - Ordered mammogram.  - Administered flu vaccine.  - Discussed Twinrix  vaccination for travel.  - Discussed Shingrix  vaccination for shingles.  - Ensure regular eye, dental, and skin check-ups.  - Review and update advanced  directives and pulse form.  In addition to the above-noted recommendations, we discussed other management options moving forward - from doing nothing to more aggressive options - as well as the risks and benefits of each option.  Throughout the discussion, she was given an opportunity to ask questions.  These were answered to her stated satisfaction.  She verbalized understanding and agreed with the plan of care.      ICD-10-CM    1. Encounter for Medicare annual wellness exam  Z00.00       2. Generalized abdominal pain  R10.84 [facility] US  ABDOMEN, COMPLETE      3. Irritable bowel syndrome with diarrhea  K58.0 [facility] US  ABDOMEN, COMPLETE      4. Diverticulosis of large intestine without hemorrhage  K57.30 [facility] US  ABDOMEN, COMPLETE      5. Mixed hyperlipidemia  E78.2 Comprehensive Metabolic Panel     Lipid Panel      6. Age-related osteoporosis without current pathological fracture  M81.0 Comprehensive Metabolic Panel     Vitamin D , 25 Hydroxy      7. Vitamin D  deficiency  E55.9 Comprehensive Metabolic Panel     Vitamin D , 25 Hydroxy      8. Chronic pain of both knees  M25.561     M25.562     G89.29       9. 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 Trigger middle finger of right hand  M65.331       11. Chronic dysfunction of right eustachian tube  H69.91       12. Chronic seasonal allergic rhinitis  due to pollen  J30.1       13. Dense breasts  R92.30       14. Counseling regarding end of life decision making  Z71.89       15. Immunization counseling  Z71.85       16. Screening for substance abuse  Z13.89       17. Negative depression screening  Z13.31       18. Breast cancer screening by mammogram  Z12.31 [facility] MAMMOGRAM BREAST SCREENING W/TOMO      19. Need for influenza vaccination  Z23 INFLUENZA HIGH-DOSE, TRIVALENT, PF (FLUZONE  HIGH-DOSE) VACCINE (65+)        RTC 1 year for [facility] Annual Exam.    History of Present Illness  Rachel [name] is a 74 year old female with irritable  bowel syndrome who presents with abdominal pain.    We reviewed PMH/PSH, family and social histories.  I obtained further information when needed and entered it into the EMR chart.    She has been experiencing diffuse abdominal discomfort/pain that began just before Thanksgiving, described as feeling like an 'angry colon'.     She reports occasional bowel urgency, but no fecal incontinence.  She denies severe abdominal pain, cramping, N/V/D, constipation, changes in bowel movement frequency or stool consistency, BRBPR, hematochezia or melena.  Likewise, she denies dysuria, frequency, hematuria, incontinence, hesitancy, retention, fever, chills, bladder pressure, flank pain, sweats, nausea, vomiting, diarrhea or dyspareunia.     She has a history of irritable bowel syndrome and has been taking probiotics, which she finds helpful.   She noted eating spicy food around the time her abdominal pain began, but is unsure if overindulgence or stress contributed. She suspects stress may be a contributing factor as she works in an musician during the election cycle, which she identifies as a potential source of stress.    We reviewed and discussed the results of labs most recently obtained prior to this appointment as well as results from previous labs for comparison and trending.  While there are abnormalities noted, the results otherwise revealed no new concerns or signficant changes to those abnormalities.  They indicate that various health conditions are stable and bring up no concerns.  Management of the etiologies for these abnormalities continues and does not need to change at this time.  Monitoring will continue.     She does not have other immediate health concerns today.  We discussed her other chronic health issues.       She is active, participating regularly in yoga and piliates.  She feels that she regularly eats a healthy, well-balanced meal.    No further intervention is needed today as there are no  changes that need to be made in management of the other chronic conditions.  She is otherwise doing well and denies any other concerns.      ROS:  A Review of Systems performed. Pertinent details discussed in HPI. Otherwise, denies any issues or other physical concerns    Vital Signs:  BP 136/78 (SITE: left arm, Orthostatic Position: sitting, Cuff Size: regular)   Pulse 81   Temp 36.1 C (97 F) (Temporal)   Ht 1.6 m (5' 3)   Wt 74.1 kg (163 lb 6.4 oz)   SpO2 97%   BMI 28.95 kg/m      Physical Exam  Constitutional:       Appearance: She is well-developed.   HENT:      Head: Normocephalic and atraumatic.  Eyes:      Conjunctiva/sclera: Conjunctivae normal.      Pupils: Pupils are equal, round, and reactive to light.   Cardiovascular:      Rate and Rhythm: Normal rate and regular rhythm.      Heart sounds: Normal heart sounds. No murmur heard.     No friction rub. No gallop.   Pulmonary:      Effort: Pulmonary effort is normal. No respiratory distress.      Breath sounds: Normal breath sounds. No wheezing or rales.   Abdominal:      General: Bowel sounds are normal. There is no distension.      Palpations: Abdomen is soft. There is no mass.      Tenderness: There is no abdominal tenderness.   Musculoskeletal:         General: Normal range of motion.      Cervical back: Normal range of motion.   Skin:     General: Skin is warm and dry.   Neurological:      Mental Status: She is alert and oriented to person, place, and time.   Psychiatric:         Behavior: Behavior normal.         Thought Content: Thought content normal.         Judgment: Judgment normal.     Preventative Healthcare Maintenance    Preventative Questionnaires  Medicare Questionnaire (Health Risk Assessment) -Scanned into patient's medical record.    All questionnaires were reviewed with the patient and the appropriate counseling was discussed.     Reviewed allergies, past medical history, past surgical history, family history, social history,  and current medications and supplements.     Advanced Care Planning  The patient /  surrogate was counseled about the importance of Advance Care Planning.  I assured the patient that such planning is voluntary and not a requirement to receive medical care.    The patient/surrogate and I reviewed ACP Documents. The documents in EMR are up to date.  Advanced Directives are dated 03/27/2005.  POLST Form is dated 08/08/2020.       Vaccinations  - Flu vaccination:  Sporadically.  Last one on record - 06/05/2023  - SARS-Cov2 vaccaintion:    - Moderna: 07/25/2019; 08/22/2019; 05/20/2020; 09/21/2020; 03/21/2022  - Moderna Bivalent:  03/04/2021  - Pfizer:  06/05/2023  RSV vaccination: 05/09/2022  - Pneumococcal vaccination:    - PPSV23:  12/27/2018  - PCV13:  09/19/2017  - PCV20: 01/25/2023  - Td/TdaP vaccination:    - TdaP:  08/11/2015  - History of chicken pox:  Unsure  - History of Shingles Outbreak:  No  - Shingles Vaccination:    - Zostavax:  08/11/2015  - Shingrix :  Discussed and recommended  - Hepatitis A/B Vaccination:    - Twinrix :  Discussed and recommended    Mental Health  PHQ-9 Score: 4 (05/28/2024 10:51 AM)  - Score Interpretation: Negative Depression Screen     Cardiovascular Health  An EKG was during the Welcome to [facility] Exam.  It revealed NSR.  Axises, wave intervals and wave morphology were all within expect parameters.     Pulmonary Health  Due to patient's smoking history, a Lung Cancer Screen (Low-dose Chest CT) is not appropriate.       Routine [facility] provided by Layman Gutta.  - LMP:  ~2011  - Last Pap/pelvic:  06/27/2013.  Recommended returning to Gyn       -  h/o abnormal exam:  No  - Last Mammogram:  05/21/2023       - h/o abnormal mammograms:  No     Bone Health  - Last DEXA Scan:  05/21/2023  - Last Results:  Stable Osteopenia with a T-Score to -2.4     GI Health  - Last Colon Cancer Screening via Cologuard on 04/18/2022 - negative  - Last colonoscopy in 05/03/2012        - Monitoring schedule:  Next colon cancer screen due in 03/2025  - Hepatitis C Screen:  Refused     Ocular Health  - Routine visits with eye specialist:  Annual exams with glaucoma screens  We discussed the importance of routine eye care to monitor for conditions such as glaucoma and cataracts.     Dental Health   - Routine dental visits:  Semiannual cleanings.  Annual check ups with X-Rays   We discussed the importance for continued dental care.     Dermatologic Health  - Routine visits to a Dermatologist:  Established with Dr. No.  Routine skin checks performed.  We discussed the importance of regular skin checks - both personal (with aid of family members) and by a medical professional.  We also discussed the need for skin protection from the sun.     Miscellaneous Preventative Healthcare Maintenance Items     The patient is negative for cognitive impairment.      Opioid Risk Screening: N/A     Substance Use Screening: N/A     Hearing/Fall Prevention: Discussed with patient. See orders as appropriate.     Stop Smoking Counseling: n/a     Reduce Alcohol Intake Counseling:yes     Exercise and Nutrition Counseling: Discussed with patient and handouts provided.     A written updated summary of Medicare Annual Wellness Visit was given to this patient. It details what healthcare maintenance has been done and when they are due for their next subsequent Medicare Annual Wellness Visit    Note:  Patient was informed that an AI tool will be used during this visit to record and transcribe the encounter. Patient was informed of and acknowledged potential risks and that they can opt out at any time. Patient verbally consented to the use of AI.     Signed,    Jayson MERTIE Mallet, MD    Patient History  Past Medical History[1]    Past Surgical History[2]    OB History   Gravida Para Term Preterm AB Living   5 3 3  0 2 3   SAB IAB Ectopic Multiple Live Births   1 1 0 0 3       Family History[3]    Social History     Socioeconomic  History    Marital status: MARRIED     Spouse name: Deniya, Craigo    Number of children: 3    Years of education: Not on file    Highest education level: Not on file   Occupational History    Occupation: Retired   Tobacco Use    Smoking status: Never    Smokeless tobacco: Never   Vaping Use    Vaping status: Never Used   Substance and Sexual Activity    Alcohol use: Yes     Alcohol/week: 7.0 - 14.0 standard drinks of alcohol     Types: 7 - 14 Glasses of wine per week    Drug use: No    Sexual activity: Yes  Partners: Male   Other Topics Concern    Military Service No    Blood Transfusions No    Caffeine Concern No    Occupational Exposure No    Hobby Hazards No    Sleep Concern No    Stress Concern No    Weight Concern Yes    Special Diet Yes    Back Care No    Exercise Yes    Bike Helmet Yes    Seat Belt Yes    Self-Exams Yes   Social History Narrative    BLANK     Social Drivers of Health     Financial Resource Strain: Low Risk (01/13/2021)    Overall Financial Resource Strain (CARDIA)     Difficulty of Paying Living Expenses: Not hard at all   Food Insecurity: No Food Insecurity (01/13/2021)    Hunger Vital Sign     Worried About Running Out of Food in the Last Year: Never true     Ran Out of Food in the Last Year: Never true   Transportation Needs: No Transportation Needs (01/13/2021)    PRAPARE - Therapist, Art (Medical): No     Lack of Transportation (Non-Medical): No   Physical Activity: Sufficiently Active (01/13/2021)    Exercise Vital Sign     Days of Exercise per Week: 5 days     Minutes of Exercise per Session: 60 min   Stress: No Stress Concern Present (01/13/2021)    [employer] of Occupational Health - Occupational Stress Questionnaire     Feeling of Stress : Only a little   Social Connections: Socially Integrated (01/13/2021)    Social Connection and Isolation Panel     Frequency of Communication with Friends and Family: More than three times a week     Frequency of  Social Gatherings with Friends and Family: More than three times a week     Attends Religious Services: More than 4 times per year     Active Member of [employer] or Organizations: Yes     Attends Engineer, Structural: More than 4 times per year     Marital Status: Married   Abuse: Not At Risk (01/13/2021)    Humiliation, Afraid, Rape, and Kick questionnaire     Fear of Current or Ex-Partner: No     Emotionally Abused: No     Physically Abused: No     Sexually Abused: No   Housing Stability: Unknown (01/13/2021)    Housing Stability Vital Sign     Unable to Pay for Housing in the Last Year: No     Number of Places Lived in the Last Year: Not on file     Unstable Housing in the Last Year: No              [1]   Past Medical History:  Diagnosis Date    Chronic dysfunction of right eustachian tube     Chronic seasonal allergic rhinitis due to pollen     Diverticulosis of large intestine without hemorrhage     Dyslipidemia     Irritable bowel syndrome with diarrhea     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 Tommas.  Next  one due in 04/2022   [3]   Family History  Problem Relation Name Age of Onset    Hypertension Mother      Chronic Renal Disease Mother      Other (Prostate Cancer with bone mets) Father      No Known Problems Sister      No Known Problems Brother      No Known Problems Maternal Grandmother      No Known Problems Maternal Grandfather      No Known Problems Paternal Grandmother      No Known Problems Paternal Grandfather      Breast Cancer Other female cousin     No Known Problems Sister      No Known Problems Daughter      No Known Problems Daughter      No Known Problems Son

## 2024-06-26 ENCOUNTER — Ambulatory Visit (HOSPITAL_BASED_OUTPATIENT_CLINIC_OR_DEPARTMENT_OTHER): Payer: Self-pay | Admitting: Family Medicine

## 2024-06-26 ENCOUNTER — Ambulatory Visit: Admission: RE | Admit: 2024-06-26 | Source: Ambulatory Visit

## 2024-07-04 ENCOUNTER — Ambulatory Visit
